# Patient Record
Sex: Female | Born: 1935 | Race: White | Hispanic: No | Smoking: Never smoker
Health system: Southern US, Community
[De-identification: ages and names within clinical notes are randomized; demographics above are authoritative.]

## PROBLEM LIST (undated history)

## (undated) DIAGNOSIS — J349 Unspecified disorder of nose and nasal sinuses: Secondary | ICD-10-CM

## (undated) DIAGNOSIS — Q613 Polycystic kidney, unspecified: Secondary | ICD-10-CM

## (undated) DIAGNOSIS — E871 Hypo-osmolality and hyponatremia: Secondary | ICD-10-CM

## (undated) DIAGNOSIS — K219 Gastro-esophageal reflux disease without esophagitis: Secondary | ICD-10-CM

## (undated) DIAGNOSIS — Z8719 Personal history of other diseases of the digestive system: Secondary | ICD-10-CM

## (undated) DIAGNOSIS — J45909 Unspecified asthma, uncomplicated: Secondary | ICD-10-CM

## (undated) DIAGNOSIS — I10 Essential (primary) hypertension: Secondary | ICD-10-CM

## (undated) DIAGNOSIS — K602 Anal fissure, unspecified: Secondary | ICD-10-CM

## (undated) DIAGNOSIS — N6019 Diffuse cystic mastopathy of unspecified breast: Secondary | ICD-10-CM

## (undated) DIAGNOSIS — M1990 Unspecified osteoarthritis, unspecified site: Secondary | ICD-10-CM

## (undated) HISTORY — DX: Unspecified osteoarthritis, unspecified site: M19.90

## (undated) HISTORY — DX: Gastro-esophageal reflux disease without esophagitis: K21.9

## (undated) HISTORY — DX: Hypo-osmolality and hyponatremia: E87.1

## (undated) HISTORY — DX: Essential (primary) hypertension: I10

## (undated) HISTORY — PX: OTHER SURGICAL HISTORY: SHX169

## (undated) HISTORY — DX: Diffuse cystic mastopathy of unspecified breast: N60.19

## (undated) HISTORY — PX: LUMBAR DISC SURGERY: SHX700

## (undated) HISTORY — DX: Anal fissure, unspecified: K60.2

## (undated) HISTORY — DX: Unspecified disorder of nose and nasal sinuses: J34.9

## (undated) HISTORY — DX: Personal history of other diseases of the digestive system: Z87.19

## (undated) HISTORY — DX: Unspecified asthma, uncomplicated: J45.909

## (undated) HISTORY — DX: Polycystic kidney, unspecified: Q61.3

---

## 1980-05-24 HISTORY — PX: MINOR HEMORRHOIDECTOMY: SHX6238

## 1980-05-24 HISTORY — PX: TOTAL ABDOMINAL HYSTERECTOMY W/ BILATERAL SALPINGOOPHORECTOMY: SHX83

## 2004-04-14 ENCOUNTER — Ambulatory Visit: Payer: Self-pay | Admitting: Nephrology

## 2004-11-02 ENCOUNTER — Emergency Department (HOSPITAL_COMMUNITY): Admission: EM | Admit: 2004-11-02 | Discharge: 2004-11-02 | Payer: Self-pay | Admitting: Emergency Medicine

## 2004-11-03 ENCOUNTER — Ambulatory Visit (HOSPITAL_COMMUNITY): Admission: RE | Admit: 2004-11-03 | Discharge: 2004-11-03 | Payer: Self-pay | Admitting: *Deleted

## 2004-12-02 ENCOUNTER — Ambulatory Visit: Payer: Self-pay | Admitting: Internal Medicine

## 2004-12-23 ENCOUNTER — Encounter: Admission: RE | Admit: 2004-12-23 | Discharge: 2004-12-23 | Payer: Self-pay | Admitting: Neurosurgery

## 2005-01-04 ENCOUNTER — Inpatient Hospital Stay (HOSPITAL_COMMUNITY): Admission: RE | Admit: 2005-01-04 | Discharge: 2005-01-05 | Payer: Self-pay | Admitting: Neurosurgery

## 2005-01-10 ENCOUNTER — Emergency Department: Payer: Self-pay | Admitting: Unknown Physician Specialty

## 2005-02-15 ENCOUNTER — Ambulatory Visit: Payer: Self-pay | Admitting: Internal Medicine

## 2005-06-07 ENCOUNTER — Ambulatory Visit: Payer: Self-pay | Admitting: Nephrology

## 2005-12-22 ENCOUNTER — Ambulatory Visit: Payer: Self-pay | Admitting: Internal Medicine

## 2006-06-28 ENCOUNTER — Ambulatory Visit: Payer: Self-pay | Admitting: Nephrology

## 2006-12-27 ENCOUNTER — Ambulatory Visit: Payer: Self-pay | Admitting: Internal Medicine

## 2007-12-29 ENCOUNTER — Ambulatory Visit: Payer: Self-pay | Admitting: Internal Medicine

## 2008-05-07 ENCOUNTER — Ambulatory Visit: Payer: Self-pay | Admitting: Nephrology

## 2008-11-22 ENCOUNTER — Ambulatory Visit: Payer: Self-pay | Admitting: General Surgery

## 2008-12-19 ENCOUNTER — Other Ambulatory Visit: Payer: Self-pay | Admitting: Internal Medicine

## 2008-12-30 ENCOUNTER — Ambulatory Visit: Payer: Self-pay | Admitting: Internal Medicine

## 2009-01-02 ENCOUNTER — Ambulatory Visit: Payer: Self-pay | Admitting: Internal Medicine

## 2009-02-03 HISTORY — PX: BREAST BIOPSY: SHX20

## 2009-08-07 ENCOUNTER — Ambulatory Visit: Payer: Self-pay | Admitting: General Surgery

## 2009-08-07 ENCOUNTER — Ambulatory Visit: Payer: Self-pay | Admitting: Nephrology

## 2010-01-05 ENCOUNTER — Ambulatory Visit: Payer: Self-pay | Admitting: Internal Medicine

## 2010-07-18 ENCOUNTER — Inpatient Hospital Stay: Payer: Self-pay | Admitting: Internal Medicine

## 2010-09-02 ENCOUNTER — Other Ambulatory Visit: Payer: Self-pay | Admitting: Internal Medicine

## 2011-01-11 ENCOUNTER — Ambulatory Visit: Payer: Self-pay | Admitting: Internal Medicine

## 2012-01-14 ENCOUNTER — Ambulatory Visit: Payer: Self-pay | Admitting: Internal Medicine

## 2012-03-23 ENCOUNTER — Telehealth: Payer: Self-pay | Admitting: Internal Medicine

## 2012-03-23 NOTE — Telephone Encounter (Signed)
I can see her tomorrow at 10:00 if she thinks she will be okay to wait until then.  Let me know if a problem.  Thanks.

## 2012-03-23 NOTE — Telephone Encounter (Signed)
Pt called she has an appointment 11/25 but she thinks she has a UTI and would like to be worked in today if possible Please advise

## 2012-03-23 NOTE — Telephone Encounter (Signed)
Pt aware of appointment 

## 2012-03-24 ENCOUNTER — Ambulatory Visit (INDEPENDENT_AMBULATORY_CARE_PROVIDER_SITE_OTHER): Payer: Medicare Other | Admitting: Internal Medicine

## 2012-03-24 ENCOUNTER — Encounter: Payer: Self-pay | Admitting: Internal Medicine

## 2012-03-24 VITALS — BP 128/72 | HR 72 | Temp 97.8°F | Ht 64.0 in | Wt 158.0 lb

## 2012-03-24 DIAGNOSIS — R5381 Other malaise: Secondary | ICD-10-CM

## 2012-03-24 DIAGNOSIS — R5383 Other fatigue: Secondary | ICD-10-CM

## 2012-03-24 DIAGNOSIS — M25559 Pain in unspecified hip: Secondary | ICD-10-CM

## 2012-03-24 DIAGNOSIS — I10 Essential (primary) hypertension: Secondary | ICD-10-CM

## 2012-03-24 DIAGNOSIS — Q613 Polycystic kidney, unspecified: Secondary | ICD-10-CM

## 2012-03-24 DIAGNOSIS — E871 Hypo-osmolality and hyponatremia: Secondary | ICD-10-CM

## 2012-03-24 DIAGNOSIS — R300 Dysuria: Secondary | ICD-10-CM

## 2012-03-24 LAB — CBC WITH DIFFERENTIAL/PLATELET
Basophils Absolute: 0 10*3/uL (ref 0.0–0.1)
Basophils Relative: 0.4 % (ref 0.0–3.0)
HCT: 36.2 % (ref 36.0–46.0)
Hemoglobin: 12 g/dL (ref 12.0–15.0)
Lymphocytes Relative: 14.7 % (ref 12.0–46.0)
MCHC: 33.1 g/dL (ref 30.0–36.0)
Neutrophils Relative %: 71.5 % (ref 43.0–77.0)
Platelets: 225 10*3/uL (ref 150.0–400.0)
RDW: 12.5 % (ref 11.5–14.6)

## 2012-03-24 LAB — POCT URINALYSIS DIPSTICK
Bilirubin, UA: NEGATIVE
Glucose, UA: NEGATIVE
Protein, UA: NEGATIVE
Spec Grav, UA: 1.01

## 2012-03-24 LAB — BASIC METABOLIC PANEL
CO2: 22 mEq/L (ref 19–32)
Chloride: 98 mEq/L (ref 96–112)
Creatinine, Ser: 1.1 mg/dL (ref 0.4–1.2)
Glucose, Bld: 99 mg/dL (ref 70–99)

## 2012-03-24 LAB — TSH: TSH: 1.57 u[IU]/mL (ref 0.35–5.50)

## 2012-03-24 MED ORDER — NITROFURANTOIN MONOHYD MACRO 100 MG PO CAPS: 100.0000 mg | ORAL_CAPSULE | Freq: Two times a day (BID) | ORAL | Status: DC

## 2012-03-24 NOTE — Patient Instructions (Addendum)
It was good to see you today.  Once I review your urine results, we will get an antibiotic called in if needed.  I am also going to schedule you to see physical therapy for your hip and let.  We will notify you of your blood test results once they are available.

## 2012-03-25 ENCOUNTER — Encounter: Payer: Self-pay | Admitting: Internal Medicine

## 2012-03-25 DIAGNOSIS — Q613 Polycystic kidney, unspecified: Secondary | ICD-10-CM | POA: Insufficient documentation

## 2012-03-25 NOTE — Assessment & Plan Note (Signed)
Blood pressure on her checks at home under good control.  Sees nephrology.  Follow.  Check metabolic panel.

## 2012-03-25 NOTE — Assessment & Plan Note (Signed)
Check sodium today.  Followed by Nephrology.

## 2012-03-25 NOTE — Assessment & Plan Note (Signed)
Reports increased fatigue.  Will check cbc, met c and tsh.

## 2012-03-25 NOTE — Assessment & Plan Note (Signed)
Renal function has been stable.  Followed by Nephrology.

## 2012-03-25 NOTE — Progress Notes (Signed)
  Subjective:    Patient ID: Sierra Gardner, female    DOB: 10-15-1924, 78 y.o.   MRN: [id number]  HPI 78 year old female with past history of polycystic kidney disease, hypertension, hyponatremia and reoccurring urinary tract infections.  She comes in today with concerns regarding a uti.  States symptoms started two days ago.  Dysuria.  No hematuria.  No nausea or vomiting.  No fever.  Taking cranberry pills - which she feels has helped.  No vaginal symptoms.  No abdominal pain.   She also reports having pain in her left knee and leg.  Saw Marian Sorrow and was diagnosed with bursitis. Was given an injection.  Helped.  Had her face and ear turn red after the injection.  Her mouth broke out as well.  Still with some discomfort.  Unable to take antiinflammatories.    Past Medical History  Diagnosis Date  . Asthma   . Polycystic kidney disease   . Hypertension   . GERD (gastroesophageal reflux disease)   . Hyponatremia     Review of Systems Patient denies any headache, lightheadedness or dizziness.  No chest pain, tightness or palpitations.  No increased shortness of breath, cough or congestion.  No nausea or vomiting.  No abdominal pain or cramping.  No bowel change, such as diarrhea, constipation, BRBPR or melana.  Urinary symptoms as outlined.  No vaginal symptoms.  Hip/leg pain as outlined.          Objective:   Physical Exam Filed Vitals:   03/24/12 1018  BP: 128/72  Pulse: 72  Temp: 97.8 F (9.80 C)   78 year old female in no acute distress.   HEENT:  Nares - clear.  OP- without lesions or erythema.  NECK:  Supple, nontender.  No audible bruit.   HEART:  Appears to be regular. LUNGS:  Without crackles or wheezing audible.  Respirations even and unlabored.   RADIAL PULSE:  Equal bilaterally.  ABDOMEN:  Soft, nontender.  No audible abdominal bruit.   EXTREMITIES:  No increased edema to be present.  Increased pain with palpation over the left lateral hip.  No pain with straight leg  raise.                   Assessment & Plan:  UTI.  Urine dip revealed trace blood with trace leukocytes.  Sent for culture.  Will treat with Macrobid bid for five days.  Follow.   MSK.  Hip and leg pain as outlined.  Injection helped.  Refer to physical therapy for evaluation and treatment.

## 2012-03-27 ENCOUNTER — Other Ambulatory Visit (INDEPENDENT_AMBULATORY_CARE_PROVIDER_SITE_OTHER): Payer: Medicare Other

## 2012-03-27 DIAGNOSIS — E871 Hypo-osmolality and hyponatremia: Secondary | ICD-10-CM

## 2012-03-27 LAB — SODIUM: Sodium: 132 mEq/L — ABNORMAL LOW (ref 135–145)

## 2012-03-28 LAB — URINE CULTURE: Colony Count: 65000

## 2012-04-17 ENCOUNTER — Encounter: Payer: Self-pay | Admitting: Internal Medicine

## 2012-04-17 ENCOUNTER — Ambulatory Visit (INDEPENDENT_AMBULATORY_CARE_PROVIDER_SITE_OTHER): Payer: Medicare Other | Admitting: Internal Medicine

## 2012-04-17 VITALS — BP 149/76 | HR 84 | Temp 97.6°F | Ht 64.0 in | Wt 158.0 lb

## 2012-04-17 DIAGNOSIS — I351 Nonrheumatic aortic (valve) insufficiency: Secondary | ICD-10-CM

## 2012-04-17 DIAGNOSIS — I10 Essential (primary) hypertension: Secondary | ICD-10-CM

## 2012-04-17 DIAGNOSIS — Q613 Polycystic kidney, unspecified: Secondary | ICD-10-CM

## 2012-04-17 DIAGNOSIS — I359 Nonrheumatic aortic valve disorder, unspecified: Secondary | ICD-10-CM

## 2012-04-17 DIAGNOSIS — E871 Hypo-osmolality and hyponatremia: Secondary | ICD-10-CM

## 2012-04-17 DIAGNOSIS — Z139 Encounter for screening, unspecified: Secondary | ICD-10-CM

## 2012-04-17 NOTE — Assessment & Plan Note (Signed)
Sodium has been relatively stable.  Followed by nephrology.  Last check here 132 - stable.  Will follow.    

## 2012-04-17 NOTE — Assessment & Plan Note (Signed)
Followed by nephrology.  Blood pressures at home under good control.  Continue same meds.  Follow metabolic panel.   

## 2012-04-17 NOTE — Assessment & Plan Note (Signed)
ECHO 09/08/11 revealed moderate aortic insufficiency.  Will follow.  Currently asymptomatic.    

## 2012-04-17 NOTE — Progress Notes (Signed)
  Subjective:    Patient ID: Sierra Gardner, female    DOB: 07-07-1924, 78 y.o.   MRN: [id number]  HPI 78 year old female with past history of polycystic kidney disease, hypertension, GERD and degenerative joint disease who comes in today for a scheduled follow up.  Reports she is doing much better.  Urinary symptoms have resolved.  No more shaky or "trembly" feelings.  Breathing is stable.  No increased sob.  Overall she feels she is doing relatively well.     Past Medical History  Diagnosis Date  . Asthma   . Polycystic kidney disease   . Hypertension   . GERD (gastroesophageal reflux disease)   . Hyponatremia   . Fibrocystic breast disease   . Degenerative joint disease   . History of cholelithiasis   . Anal fissure     Review of Systems Patient denies any headache, lightheadedness or dizziness.  No sinus or allergy symptoms.   No chest pain, tightness or palpitations.  No increased shortness of breath, cough or congestion.  No nausea or vomiting.  No abdominal pain or cramping.  No acid reflux.  No bowel change, such as diarrhea, constipation, BRBPR or melana.  No urine change.   Went to physical therapy.  Leg is better.       Objective:   Physical Exam Filed Vitals:   04/17/12 1013  BP: 149/76  Pulse: 84  Temp: 97.6 F (36.13 C)   78 year old female in no acute distress.   HEENT:  Nares - clear.  OP- without lesions or erythema.  NECK:  Supple, nontender.  No audible bruit.   HEART:  Appears to be regular. LUNGS:  Without crackles or wheezing audible.  Respirations even and unlabored.   RADIAL PULSE:  Equal bilaterally.  ABDOMEN:  Soft, nontender.  No audible abdominal bruit.   EXTREMITIES:  No increased edema to be present.  Sable.                     Assessment & Plan:  GU.  Recently treated for a bladder infection.   Symptoms have resolved.  Follow.   GI.  Colonoscopy 11/22/08 normal.  Has a history of an anal fissure.  Currently doing well.  Hemoccult card negative  4/13.    PREVIOUS BREAST CHANGE.  Saw Dr Lemar Livings.  S/p benign biopsy.  Last mammogram 01/14/12 - BiRADS II.     CARDIOVASCULAR.  ECHO  09/08/11 revealed moderate aortic insufficiency with EF 50-55%.  Follow.  Currently asymptomatic.    HEALTH MAINTENANCE.  Last physical 08/17/11.  She is s/p hysterectomy.  Colonoscopy as outlined. Will notify me when agreeable for bone density.  Mammogram as outlined above.  Had her flu shot and had a relatively recent pneumovax.

## 2012-04-17 NOTE — Patient Instructions (Signed)
It was nice seeing you today.  I am glad you are feeling better.  Let me know if you need anything.  

## 2012-04-17 NOTE — Assessment & Plan Note (Signed)
Is followed by nephrology.   Stable.   

## 2012-04-19 ENCOUNTER — Other Ambulatory Visit: Payer: Medicare Other

## 2012-04-19 ENCOUNTER — Telehealth: Payer: Self-pay | Admitting: Internal Medicine

## 2012-04-19 ENCOUNTER — Other Ambulatory Visit: Payer: Self-pay | Admitting: Internal Medicine

## 2012-04-19 DIAGNOSIS — R300 Dysuria: Secondary | ICD-10-CM

## 2012-04-19 NOTE — Telephone Encounter (Signed)
Patient has lab appoint today

## 2012-04-19 NOTE — Telephone Encounter (Signed)
Patient Information:  Caller Name: Katja  Phone: [phone]  Patient: Sierra, Gardner  Gender: Female  DOB: 15-Jan-1925  Age: 78 Years  PCP: Dale Zumbrota   Symptoms  Reason For Call & Symptoms: urgency of urine  Reviewed Health History In EMR: Yes  Reviewed Medications In EMR: Yes  Reviewed Allergies In EMR: Yes  Date of Onset of Symptoms: 04/18/2012  Guideline(s) Used:  Urination Pain - Female  Disposition Per Guideline:   See Today in Office  Reason For Disposition Reached:   Age > 50 years  Advice Given:  N/A  Office Follow Up:  Does the office need to follow up with this patient?: Yes  Instructions For The Office: review for medication needs  RN Overrode Recommendation:  Follow Up With Office Later  asking for medication to have on hand, denies current sx  RN Note:  Has to restrict her fluid intake due to hyponatremia.  Denies any sx today.  Had a sharp pain yesterday but that has diminished.  She states that due to her polycystic kidneys she would like to have an antibiotic available in case the sx return.  Uses [facility] [address] at [phone].

## 2012-04-19 NOTE — Telephone Encounter (Signed)
Confirm with pt - no other symptoms.  (ie no nausea or vomiting, increased abdominal pain, etc).  If no, then have her at least come and drop off a urine so that we can see before starting an abx.  i will place order.  She will need to come in today and drop off urine.  If questions, let me know.

## 2012-04-19 NOTE — Telephone Encounter (Signed)
See note.  Need to have pt come in for urine.  I have already put in the order

## 2012-04-21 ENCOUNTER — Telehealth: Payer: Self-pay | Admitting: *Deleted

## 2012-04-21 ENCOUNTER — Other Ambulatory Visit (INDEPENDENT_AMBULATORY_CARE_PROVIDER_SITE_OTHER): Payer: Medicare Other | Admitting: *Deleted

## 2012-04-21 ENCOUNTER — Encounter: Payer: Self-pay | Admitting: *Deleted

## 2012-04-21 DIAGNOSIS — R300 Dysuria: Secondary | ICD-10-CM

## 2012-04-21 LAB — URINALYSIS
Bilirubin Urine: NEGATIVE
Ketones, ur: NEGATIVE
Leukocytes, UA: NEGATIVE
Specific Gravity, Urine: 1.01 (ref 1.000–1.030)
Total Protein, Urine: NEGATIVE
Urine Glucose: NEGATIVE
pH: 7.5 (ref 5.0–8.0)

## 2012-04-21 NOTE — Progress Notes (Signed)
Called and gave lab results to patient. Patient stated that she is feeling better with know signs of uti symptoms.

## 2012-04-21 NOTE — Telephone Encounter (Signed)
Opened by mistake.

## 2012-04-21 NOTE — Progress Notes (Signed)
Will await cx

## 2012-04-24 LAB — CULTURE, URINE COMPREHENSIVE: Colony Count: 50000

## 2012-04-25 MED ORDER — NITROFURANTOIN MONOHYD MACRO 100 MG PO CAPS: 100.0000 mg | ORAL_CAPSULE | Freq: Two times a day (BID) | ORAL | Status: DC

## 2012-04-25 NOTE — Addendum Note (Signed)
Addended by: Algis Downs on: 04/25/2012 05:17 PM   Modules accepted: Orders

## 2012-08-08 ENCOUNTER — Other Ambulatory Visit: Payer: Medicare Other

## 2012-08-15 ENCOUNTER — Encounter: Payer: Medicare Other | Admitting: Internal Medicine

## 2012-08-15 ENCOUNTER — Other Ambulatory Visit: Payer: Medicare Other

## 2012-08-29 ENCOUNTER — Other Ambulatory Visit (INDEPENDENT_AMBULATORY_CARE_PROVIDER_SITE_OTHER): Payer: Medicare Other

## 2012-08-29 ENCOUNTER — Encounter: Payer: Self-pay | Admitting: Internal Medicine

## 2012-08-29 ENCOUNTER — Ambulatory Visit (INDEPENDENT_AMBULATORY_CARE_PROVIDER_SITE_OTHER): Payer: Medicare Other | Admitting: Internal Medicine

## 2012-08-29 VITALS — BP 140/68 | HR 80 | Temp 97.6°F | Resp 18 | Ht 62.0 in | Wt 156.8 lb

## 2012-08-29 DIAGNOSIS — I10 Essential (primary) hypertension: Secondary | ICD-10-CM

## 2012-08-29 DIAGNOSIS — I359 Nonrheumatic aortic valve disorder, unspecified: Secondary | ICD-10-CM

## 2012-08-29 DIAGNOSIS — Z139 Encounter for screening, unspecified: Secondary | ICD-10-CM

## 2012-08-29 DIAGNOSIS — Z1239 Encounter for other screening for malignant neoplasm of breast: Secondary | ICD-10-CM

## 2012-08-29 DIAGNOSIS — Q613 Polycystic kidney, unspecified: Secondary | ICD-10-CM

## 2012-08-29 DIAGNOSIS — E871 Hypo-osmolality and hyponatremia: Secondary | ICD-10-CM

## 2012-08-29 DIAGNOSIS — I351 Nonrheumatic aortic (valve) insufficiency: Secondary | ICD-10-CM

## 2012-08-29 LAB — COMPREHENSIVE METABOLIC PANEL
ALT: 13 U/L (ref 0–35)
AST: 19 U/L (ref 0–37)
Alkaline Phosphatase: 52 U/L (ref 39–117)
Calcium: 9.4 mg/dL (ref 8.4–10.5)
Chloride: 100 mEq/L (ref 96–112)
Creatinine, Ser: 1 mg/dL (ref 0.4–1.2)

## 2012-08-29 LAB — LIPID PANEL
HDL: 49.7 mg/dL (ref >39.00)
LDL Cholesterol: 99 mg/dL (ref 0–99)
Total CHOL/HDL Ratio: 3
VLDL: 16.2 mg/dL (ref 0.0–40.0)

## 2012-08-29 NOTE — Progress Notes (Signed)
Subjective:    Patient ID: Sierra Gardner, female    DOB: [birth year], 79 y.o.   MRN: [id number]  HPI 79 year old female with past history of polycystic kidney disease, hypertension, GERD and degenerative joint disease who comes in today to follow up on these issues as well as for a complete physical exam.   Reports she is doing much better.  Urinary symptoms have resolved.  She is taking a cranberry tablet daily and feels this helps.  Breathing is stable.  No increased sob.  Overall she feels she is doing relatively well.  Had follow up with nephrology two weeks ago.  Felt things were stable.     Past Medical History  Diagnosis Date  . Asthma   . Polycystic kidney disease   . Hypertension   . GERD (gastroesophageal reflux disease)   . Hyponatremia   . Fibrocystic breast disease   . Degenerative joint disease   . History of cholelithiasis   . Anal fissure     Current Outpatient Prescriptions on File Prior to Visit  Medication Sig Dispense Refill  . Calcium Carbonate-Vitamin D (CALCIUM 600 + D PO) Take 600 mg by mouth 2 (two) times daily.       . cholecalciferol (VITAMIN D) 1000 UNITS tablet Take 2,000 Units by mouth daily.      . lansoprazole (PREVACID) 30 MG capsule Take 30 mg by mouth daily.      Marland Kitchen lisinopril (PRINIVIL,ZESTRIL) 40 MG tablet Take 40 mg by mouth 2 (two) times daily.       . Magnesium 250 MG TABS Take 250 mg by mouth daily.       . nitrofurantoin, macrocrystal-monohydrate, (MACROBID) 100 MG capsule Take 1 capsule (100 mg total) by mouth 2 (two) times daily.  10 capsule  0  . spironolactone (ALDACTONE) 25 MG tablet 25 mg. 1 1/2 tablet q day      . verapamil (CALAN) 120 MG tablet Take 120 mg by mouth. Takes 1/2 tablet daily      . vitamin C (ASCORBIC ACID) 500 MG tablet Take 500 mg by mouth daily.       No current facility-administered medications on file prior to visit.    Review of Systems Patient denies any headache, lightheadedness or dizziness.  No sinus or  allergy symptoms.   No chest pain, tightness or palpitations.  No increased shortness of breath, cough or congestion.  No nausea or vomiting.  No abdominal pain or cramping.  No acid reflux.  No bowel change, such as diarrhea, constipation, BRBPR or melana.  No urine change.  Cranberry tablet helping.       Objective:   Physical Exam  Filed Vitals:   08/29/12 1025  BP: 140/68  Pulse: 80  Temp: 97.6 F (36.4 C)  Resp: 8   79 year old female in no acute distress.   HEENT:  Nares- clear.  Oropharynx - without lesions. NECK:  Supple.  Nontender.  No audible bruit.  HEART:  Appears to be regular. LUNGS:  No crackles or wheezing audible.  Respirations even and unlabored.  RADIAL PULSE:  Equal bilaterally.    BREASTS:  No nipple discharge or nipple retraction present.  Could not appreciate any distinct nodules or axillary adenopathy.  ABDOMEN:  Soft, nontender.  Bowel sounds present and normal.  No audible abdominal bruit.  GU:  She declined.    RECTAL:  She declined.    EXTREMITIES:  No increased edema present.  DP pulses palpable  and equal bilaterally.             Assessment & Plan:  GU.  Recently treated for a bladder infection.   Symptoms have resolved.  Follow.  Taking a cranberry tablet daily now and she feels this is helping.  Follow.   GI.  Colonoscopy 11/22/08 normal.  Has a history of an anal fissure.  Currently doing well.    PREVIOUS BREAST CHANGE.  Saw Dr Lemar Livings.  S/p benign biopsy.  Last mammogram 01/14/12 - BiRADS II.  Schedule a follow up mammogram when due.     CARDIOVASCULAR.  ECHO  09/08/11 revealed moderate aortic insufficiency with EF 50-55%.  Follow.  Currently asymptomatic.    HEALTH MAINTENANCE.  Physical today.  She is s/p hysterectomy.  Colonoscopy as outlined. Will notify me when agreeable for bone density.  Mammogram as outlined above.  Schedule a follow up mammogram when due.  Had her flu shot and had a relatively recent pneumovax.

## 2012-09-01 ENCOUNTER — Encounter: Payer: Self-pay | Admitting: Internal Medicine

## 2012-09-03 ENCOUNTER — Encounter: Payer: Self-pay | Admitting: Internal Medicine

## 2012-09-03 NOTE — Assessment & Plan Note (Signed)
Is followed by nephrology.   Stable.   

## 2012-09-03 NOTE — Assessment & Plan Note (Signed)
Sodium has been relatively stable.  Followed by nephrology.  Last check here 132 - stable.  Will follow.

## 2012-09-03 NOTE — Assessment & Plan Note (Signed)
Followed by nephrology.  Blood pressures at home under good control.  Continue same meds.  Follow metabolic panel.   

## 2012-09-03 NOTE — Assessment & Plan Note (Signed)
ECHO 09/08/11 revealed moderate aortic insufficiency.  Will follow.  Currently asymptomatic.

## 2012-11-10 ENCOUNTER — Encounter: Payer: Self-pay | Admitting: Adult Health

## 2012-11-10 ENCOUNTER — Telehealth: Payer: Self-pay | Admitting: Internal Medicine

## 2012-11-10 ENCOUNTER — Ambulatory Visit (INDEPENDENT_AMBULATORY_CARE_PROVIDER_SITE_OTHER): Payer: Medicare Other | Admitting: Adult Health

## 2012-11-10 VITALS — BP 142/60 | HR 70 | Temp 98.2°F | Resp 12 | Wt 155.5 lb

## 2012-11-10 DIAGNOSIS — R300 Dysuria: Secondary | ICD-10-CM | POA: Insufficient documentation

## 2012-11-10 LAB — POCT URINALYSIS DIPSTICK
Bilirubin, UA: NEGATIVE
Leukocytes, UA: NEGATIVE
Nitrite, UA: NEGATIVE
Protein, UA: 30
Urobilinogen, UA: 0.2
pH, UA: 7

## 2012-11-10 MED ORDER — NITROFURANTOIN MONOHYD MACRO 100 MG PO CAPS: 100.0000 mg | ORAL_CAPSULE | Freq: Two times a day (BID) | ORAL | Status: DC

## 2012-11-10 NOTE — Progress Notes (Signed)
  Subjective:    Patient ID: Sierra Gardner, female    DOB: 06-13-1924, 79 y.o.   MRN: [id number]  HPI  Patient presents to clinic with complaints of one day history of dysuria, urgency and frequency. She reports history of UTIs treated with Macrodantin. She denies fever, chills or hematuria.  Current Outpatient Prescriptions on File Prior to Visit  Medication Sig Dispense Refill  . Calcium Carbonate-Vitamin D (CALCIUM 600 + D PO) Take 600 mg by mouth 2 (two) times daily.       . cholecalciferol (VITAMIN D) 1000 UNITS tablet Take 2,000 Units by mouth daily.      . lansoprazole (PREVACID) 30 MG capsule Take 30 mg by mouth daily.      Marland Kitchen lisinopril (PRINIVIL,ZESTRIL) 40 MG tablet Take 40 mg by mouth 2 (two) times daily.       . Magnesium 250 MG TABS Take 250 mg by mouth daily.       Marland Kitchen spironolactone (ALDACTONE) 25 MG tablet 25 mg. 1 1/2 tablet q day      . verapamil (CALAN) 120 MG tablet Take 120 mg by mouth. Takes 1/2 tablet daily      . vitamin C (ASCORBIC ACID) 500 MG tablet Take 500 mg by mouth daily.       No current facility-administered medications on file prior to visit.     Review of Systems  Genitourinary: Positive for dysuria, frequency and hematuria.   BP 142/60  Pulse 70  Temp(Src) 98.2 F (36.8 C) (Oral)  Resp 12  Wt 155 lb 8 oz (70.534 kg)  BMI 28.43 kg/m2  SpO2 95%    Objective:   Physical Exam  Constitutional: She is oriented to person, place, and time.  Genitourinary:  Suprapubic tenderness  Neurological: She is alert and oriented to person, place, and time. She has normal reflexes.  Skin: Skin is warm and dry.  Psychiatric: She has a normal mood and affect. Her behavior is normal. Judgment and thought content normal.          Assessment & Plan:

## 2012-11-10 NOTE — Assessment & Plan Note (Signed)
Start Macrobid 100 mg twice a day x5 days. RTC if symptoms are not improved within completion of treatment or sooner if symptoms worsen.

## 2012-11-10 NOTE — Telephone Encounter (Signed)
Patient Information:  Caller Name: Ahmaya  Phone: [phone]  Patient: Sierra Gardner, Sierra Gardner  Gender: Female  DOB: Dec 26, 1924  Age: 79 Years  PCP: Dale Gil[place]  Office Follow Up:  Does the office need to follow up with this patient?: No  Instructions For The Office: N/A  RN Note:  pt wanted to know if she needed to bring in specimen or have meds called in for her.  RN advised for pt to be seen  Symptoms  Reason For Call & Symptoms: UTI symptoms:  pain with urination.  No back pain  Reviewed Health History In EMR: Yes  Reviewed Medications In EMR: Yes  Reviewed Allergies In EMR: Yes  Reviewed Surgeries / Procedures: Yes  Date of Onset of Symptoms: 11/09/2012  Guideline(s) Used:  Urination Pain - Female  Disposition Per Guideline:   See Today in Office  Reason For Disposition Reached:   All other females with painful urination, or patient wants to be seen  Advice Given:  N/A  Patient Will Follow Care Advice:  YES  Appointment Scheduled:  11/10/2012 11:15:00 Appointment Scheduled Provider:  Orville Govern

## 2012-11-22 ENCOUNTER — Telehealth: Payer: Self-pay | Admitting: Internal Medicine

## 2012-11-22 NOTE — Telephone Encounter (Signed)
Patient Information:  Caller Name: Lux  Phone: [phone]  Patient: Sierra Gardner, Sierra Gardner  Gender: Female  DOB: Nov 02, 1924  Age: 79 Years  PCP: Dale [place]  Office Follow Up:  Does the office need to follow up with this patient?: Yes  Instructions For The Office: Please call the pt and advise if she can "drop of a urine specimen"   Symptoms  Reason For Call & Symptoms: Pt was seen at the office 12 days ago. She was dx with a UTI and put on 5 days of Macrobid. Pt states her sx improved but she still has urgency/frequency and dysuria. Pt would like to bring a specimen over to be tested. She has a testing kit at home.  Reviewed Health History In EMR: Yes  Reviewed Medications In EMR: Yes  Reviewed Allergies In EMR: Yes  Reviewed Surgeries / Procedures: Yes  Date of Onset of Symptoms: 11/20/2012  Guideline(s) Used:  Urination Pain - Female  Disposition Per Guideline:   See Today in Office  Reason For Disposition Reached:   Taking antibiotic > 3 days for UTI and painful urination not improved  Advice Given:  N/A  Patient Refused Recommendation:  Pt would like to drop off a sample of urine to be tested.

## 2012-11-22 NOTE — Telephone Encounter (Signed)
Raquel has a 4:15 if you would like her to be seen, or can she just drop off a sample?

## 2012-11-22 NOTE — Telephone Encounter (Signed)
Unable to call in abx over the phone.  She needs to be seen.  I can see her tomorrow at 9:15.  Block spot.  Thanks.

## 2012-11-22 NOTE — Telephone Encounter (Signed)
Pt coming in tomorrow

## 2012-11-23 ENCOUNTER — Encounter: Payer: Self-pay | Admitting: Internal Medicine

## 2012-11-23 ENCOUNTER — Ambulatory Visit (INDEPENDENT_AMBULATORY_CARE_PROVIDER_SITE_OTHER): Payer: Medicare Other | Admitting: Internal Medicine

## 2012-11-23 VITALS — BP 130/70 | HR 67 | Resp 18 | Ht 62.0 in | Wt 155.2 lb

## 2012-11-23 DIAGNOSIS — I10 Essential (primary) hypertension: Secondary | ICD-10-CM

## 2012-11-23 DIAGNOSIS — R300 Dysuria: Secondary | ICD-10-CM

## 2012-11-23 DIAGNOSIS — N390 Urinary tract infection, site not specified: Secondary | ICD-10-CM

## 2012-11-23 LAB — POCT URINALYSIS DIPSTICK
Bilirubin, UA: NEGATIVE
Glucose, UA: NEGATIVE
Ketones, UA: NEGATIVE
Spec Grav, UA: 1.015

## 2012-11-23 MED ORDER — CEPHALEXIN 500 MG PO CAPS: 500.0000 mg | ORAL_CAPSULE | Freq: Three times a day (TID) | ORAL | Status: DC

## 2012-11-23 NOTE — Progress Notes (Signed)
  Subjective:    Patient ID: Sierra Gardner, female    DOB: [birth year], 79 y.o.   MRN: [id number]  Dysuria   79 year old female with past history of polycystic kidney disease, hypertension, hyponatremia and reoccurring urinary tract infections.  She comes in today with concerns regarding a uti.  States symptoms started a few days ago.  Dysuria.  No hematuria.  No nausea or vomiting.  No fever.  Taking cranberry pills - which she feels has helped.  No vaginal symptoms.  No abdominal pain.  Eating and drinking ok.  States the last time she took macrobid, felt sick on her stomach.  Has previously tolerated this medication.     Past Medical History  Diagnosis Date  . Asthma   . Polycystic kidney disease   . Hypertension   . GERD (gastroesophageal reflux disease)   . Hyponatremia   . Fibrocystic breast disease   . Degenerative joint disease   . History of cholelithiasis   . Anal fissure     Current Outpatient Prescriptions on File Prior to Visit  Medication Sig Dispense Refill  . Calcium Carbonate-Vitamin D (CALCIUM 600 + D PO) Take 600 mg by mouth 2 (two) times daily.       . cholecalciferol (VITAMIN D) 1000 UNITS tablet Take 2,000 Units by mouth daily.      . lansoprazole (PREVACID) 30 MG capsule Take 30 mg by mouth daily.      Marland Kitchen lisinopril (PRINIVIL,ZESTRIL) 40 MG tablet Take 40 mg by mouth 2 (two) times daily.       . Magnesium 250 MG TABS Take 250 mg by mouth daily.       Marland Kitchen spironolactone (ALDACTONE) 25 MG tablet 25 mg. 1 1/2 tablet q day      . verapamil (CALAN) 120 MG tablet Take 120 mg by mouth. Takes 1/2 tablet daily      . vitamin C (ASCORBIC ACID) 500 MG tablet Take 500 mg by mouth daily.       No current facility-administered medications on file prior to visit.    Review of Systems  Genitourinary: Positive for dysuria.  Patient denies any headache, lightheadedness or dizziness.  No chest pain, tightness or palpitations.  Breathing is stable.  No nausea or vomiting.  No  abdominal pain or cramping.  No bowel change, such as diarrhea.  Bowels stable. Urinary symptoms as outlined.  No vaginal symptoms.           Objective:   Physical Exam  Filed Vitals:   11/23/12 0912  BP: 130/70  Pulse: 67  Resp: 63   79 year old female in no acute distress. HEART:  Appears to be regular. LUNGS:  Without crackles or wheezing audible.  Respirations even and unlabored. ABDOMEN:  Soft, nontender.  No audible abdominal bruit.                  Assessment & Plan:  UTI.  Urine dip revealed small amount of blood with moderate leukocytes.  Sent for culture.  Will treat with keflex  bid for seven days.  Follow.  She has multiple drug allergies.  Had problems with macrobid - last infection (GI intolerance?)

## 2012-11-27 ENCOUNTER — Encounter: Payer: Self-pay | Admitting: Internal Medicine

## 2012-11-27 NOTE — Assessment & Plan Note (Signed)
Symptoms and exam as outlined.  Urine dip as outlined.  Treat wit Keflex.  Culture sent.

## 2012-11-27 NOTE — Assessment & Plan Note (Signed)
Blood pressure under control.  Follow.

## 2012-12-29 ENCOUNTER — Ambulatory Visit (INDEPENDENT_AMBULATORY_CARE_PROVIDER_SITE_OTHER): Payer: Medicare Other | Admitting: Internal Medicine

## 2012-12-29 ENCOUNTER — Encounter: Payer: Self-pay | Admitting: Internal Medicine

## 2012-12-29 VITALS — BP 130/64 | HR 68 | Temp 98.2°F | Ht 62.0 in | Wt 156.5 lb

## 2012-12-29 DIAGNOSIS — I10 Essential (primary) hypertension: Secondary | ICD-10-CM

## 2012-12-29 DIAGNOSIS — Q613 Polycystic kidney, unspecified: Secondary | ICD-10-CM

## 2012-12-29 DIAGNOSIS — I359 Nonrheumatic aortic valve disorder, unspecified: Secondary | ICD-10-CM

## 2012-12-29 DIAGNOSIS — M4800 Spinal stenosis, site unspecified: Secondary | ICD-10-CM

## 2012-12-29 DIAGNOSIS — E871 Hypo-osmolality and hyponatremia: Secondary | ICD-10-CM

## 2012-12-29 DIAGNOSIS — I351 Nonrheumatic aortic (valve) insufficiency: Secondary | ICD-10-CM

## 2012-12-29 LAB — BASIC METABOLIC PANEL
BUN: 21 mg/dL (ref 6–23)
CO2: 24 mEq/L (ref 19–32)
Chloride: 95 mEq/L — ABNORMAL LOW (ref 96–112)
Glucose, Bld: 95 mg/dL (ref 70–99)
Potassium: 4.6 mEq/L (ref 3.5–5.1)

## 2012-12-29 NOTE — Progress Notes (Signed)
Subjective:    Patient ID: Sierra Gardner, female    DOB: 06-21-1924, 79 y.o.   MRN: [id number]  HPI 79 year old female with past history of polycystic kidney disease, hypertension, GERD and degenerative joint disease who comes in today for a scheduled follow up.  Reports she is doing much better.  Urinary symptoms have resolved.  She is taking a cranberry tablet daily and feels this helps.  Breathing is stable.  No increased sob.  Overall she feels she is doing relatively well.  Still seeing nephrology.  Due follow up in the fall 2014.  Has follow up with Dr Darrold Junker next month.  Blood pressure has been doing well on outside checks averaging 110-120s/50-60s.  She fell approximately one week ago - in her driveway.  Had some soreness in her neck.  Better now.  Some minimal bruises.  No headache or dizziness after the fall.  Feels better.  She has seen Dr Rosita Kea.  Had xray reviewed.  Has nerve damage.  Has stenosis.  Right leg shorter.  She has started putting something in her shoe and this has helped.  Overall she feels things are stable.      Past Medical History  Diagnosis Date  . Asthma   . Polycystic kidney disease   . Hypertension   . GERD (gastroesophageal reflux disease)   . Hyponatremia   . Fibrocystic breast disease   . Degenerative joint disease   . History of cholelithiasis   . Anal fissure     Current Outpatient Prescriptions on File Prior to Visit  Medication Sig Dispense Refill  . Calcium Carbonate-Vitamin D (CALCIUM 600 + D PO) Take 600 mg by mouth 2 (two) times daily.       . cholecalciferol (VITAMIN D) 1000 UNITS tablet Take 2,000 Units by mouth daily.      . lansoprazole (PREVACID) 30 MG capsule Take 30 mg by mouth daily.      Marland Kitchen lisinopril (PRINIVIL,ZESTRIL) 40 MG tablet Take 40 mg by mouth 2 (two) times daily.       . Magnesium 250 MG TABS Take 250 mg by mouth daily.       Marland Kitchen spironolactone (ALDACTONE) 25 MG tablet 25 mg. 1 1/2 tablet q day      . verapamil (CALAN) 120 MG  tablet Take 120 mg by mouth. Takes 1/2 tablet daily      . vitamin C (ASCORBIC ACID) 500 MG tablet Take 500 mg by mouth daily.       No current facility-administered medications on file prior to visit.    Review of Systems Patient denies any headache, lightheadedness or dizziness.  No sinus or allergy symptoms.   No chest pain, tightness or palpitations.  No increased shortness of breath, cough or congestion.  Breathing stable.  No nausea or vomiting.  No abdominal pain or cramping.  No acid reflux.  No bowel change, such as diarrhea, constipation, BRBPR or melana.  No urine change.  No significant residual pain from the fall.       Objective:   Physical Exam  Filed Vitals:   12/29/12 0912  BP: 130/64  Pulse: 68  Temp: 98.2 F (33.42 C)   79 year old female in no acute distress.   HEENT:  Nares- clear.  Oropharynx - without lesions. NECK:  Supple.  Nontender.  No audible bruit.  HEART:  Appears to be regular. LUNGS:  No crackles or wheezing audible.  Respirations even and unlabored.  RADIAL PULSE:  Equal bilaterally.   ABDOMEN:  Soft, nontender.  Bowel sounds present and normal.  No audible abdominal bruit.     EXTREMITIES:  No increased edema present.  DP pulses palpable and equal bilaterally.             Assessment & Plan:  GU.  Recently treated for a bladder infection.   Symptoms have resolved.  Follow.  Taking a cranberry tablet daily now and she feels this is helping.  Follow.   S/P FALL.  Doing well.  No residual pain or problems.    GI.  Colonoscopy 11/22/08 normal.  Has a history of an anal fissure.  Currently doing well.    PREVIOUS BREAST CHANGE.  Saw Dr Lemar Livings.  S/p benign biopsy.  Last mammogram 01/14/12 - BiRADS II.  Schedule a follow up mammogram when due.     CARDIOVASCULAR.  ECHO  09/08/11 revealed moderate aortic insufficiency with EF 50-55%.  Follow.  Currently asymptomatic.    HEALTH MAINTENANCE.  Physical 08/29/12.  She is s/p hysterectomy.  Colonoscopy as  outlined. Will notify me when agreeable for bone density.  Mammogram as outlined above.

## 2012-12-31 ENCOUNTER — Encounter: Payer: Self-pay | Admitting: Internal Medicine

## 2012-12-31 DIAGNOSIS — M4800 Spinal stenosis, site unspecified: Secondary | ICD-10-CM | POA: Insufficient documentation

## 2012-12-31 NOTE — Assessment & Plan Note (Addendum)
ECHO 09/08/11 revealed moderate aortic insufficiency.  Will follow.  Currently asymptomatic.  Has follow up with Dr Darrold Junker next month.

## 2012-12-31 NOTE — Assessment & Plan Note (Signed)
Is followed by nephrology.   Stable.  Due follow up in the fall 2014.  Planning for follow up scanning at that time.

## 2012-12-31 NOTE — Assessment & Plan Note (Signed)
Sodium has been relatively stable.  Followed by nephrology.  Will follow.   

## 2012-12-31 NOTE — Assessment & Plan Note (Signed)
Saw Dr Rosita Kea.  Support in her shoe has helped.   Follow.

## 2012-12-31 NOTE — Assessment & Plan Note (Signed)
Followed by nephrology.  Blood pressures at home under good control.  Continue same meds.  Follow metabolic panel.   

## 2013-01-01 ENCOUNTER — Other Ambulatory Visit: Payer: Self-pay | Admitting: Internal Medicine

## 2013-01-01 DIAGNOSIS — E871 Hypo-osmolality and hyponatremia: Secondary | ICD-10-CM

## 2013-01-01 NOTE — Progress Notes (Signed)
Order placed for f/u sodium.  ?

## 2013-01-04 ENCOUNTER — Other Ambulatory Visit (INDEPENDENT_AMBULATORY_CARE_PROVIDER_SITE_OTHER): Payer: Medicare Other

## 2013-01-04 DIAGNOSIS — E871 Hypo-osmolality and hyponatremia: Secondary | ICD-10-CM

## 2013-01-04 LAB — SODIUM: Sodium: 129 mEq/L — ABNORMAL LOW (ref 135–145)

## 2013-01-05 ENCOUNTER — Other Ambulatory Visit: Payer: Self-pay | Admitting: Internal Medicine

## 2013-01-05 DIAGNOSIS — E871 Hypo-osmolality and hyponatremia: Secondary | ICD-10-CM

## 2013-01-05 NOTE — Progress Notes (Signed)
Order placed for f/u sodium.  ?

## 2013-01-09 ENCOUNTER — Other Ambulatory Visit (INDEPENDENT_AMBULATORY_CARE_PROVIDER_SITE_OTHER): Payer: Medicare Other

## 2013-01-09 DIAGNOSIS — E871 Hypo-osmolality and hyponatremia: Secondary | ICD-10-CM

## 2013-01-10 ENCOUNTER — Encounter: Payer: Self-pay | Admitting: *Deleted

## 2013-01-10 ENCOUNTER — Other Ambulatory Visit: Payer: Self-pay | Admitting: Internal Medicine

## 2013-01-10 DIAGNOSIS — E871 Hypo-osmolality and hyponatremia: Secondary | ICD-10-CM

## 2013-01-10 NOTE — Progress Notes (Signed)
Follow up sodium lab ordered.

## 2013-01-15 ENCOUNTER — Ambulatory Visit: Payer: Self-pay | Admitting: Internal Medicine

## 2013-01-16 ENCOUNTER — Ambulatory Visit (INDEPENDENT_AMBULATORY_CARE_PROVIDER_SITE_OTHER): Payer: Medicare Other | Admitting: Adult Health

## 2013-01-16 ENCOUNTER — Encounter: Payer: Self-pay | Admitting: Adult Health

## 2013-01-16 VITALS — BP 140/60 | HR 69 | Temp 97.9°F | Resp 12 | Wt 155.0 lb

## 2013-01-16 DIAGNOSIS — N390 Urinary tract infection, site not specified: Secondary | ICD-10-CM

## 2013-01-16 DIAGNOSIS — R300 Dysuria: Secondary | ICD-10-CM

## 2013-01-16 LAB — POCT URINALYSIS DIPSTICK
Glucose, UA: NEGATIVE
Nitrite, UA: NEGATIVE
Spec Grav, UA: 1.015
Urobilinogen, UA: 0.2

## 2013-01-16 NOTE — Progress Notes (Signed)
  Subjective:    Patient ID: Sierra Gardner, female    DOB: 07/19/1924, 79 y.o.   MRN: [id number]  HPI  Patient is a pleasant 79 year old female who presents to clinic status post visit to urgent care on 01/06/2013 for UTI. She was started on Macrobid and took this medication for 7 days. She has completed her course. She is here for recheck of urinalysis. She is feeling much improved. Denies dysuria, suprapubic tenderness, fever or chills.  Current Outpatient Prescriptions on File Prior to Visit  Medication Sig Dispense Refill  . Biotin 1000 MCG tablet Take 1,000 mcg by mouth daily.      . Calcium Carbonate-Vitamin D (CALCIUM 600 + D PO) Take 600 mg by mouth 2 (two) times daily.       . cholecalciferol (VITAMIN D) 1000 UNITS tablet Take 2,000 Units by mouth daily.      Marland Kitchen CRANBERRY PO Take by mouth.      . lansoprazole (PREVACID) 30 MG capsule Take 30 mg by mouth daily.      Marland Kitchen lisinopril (PRINIVIL,ZESTRIL) 40 MG tablet Take 40 mg by mouth 2 (two) times daily.       . Magnesium 250 MG TABS Take 250 mg by mouth daily.       Marland Kitchen spironolactone (ALDACTONE) 25 MG tablet 25 mg. 1 1/2 tablet q day      . verapamil (CALAN) 120 MG tablet Take 120 mg by mouth. Takes 1/2 tablet daily      . vitamin C (ASCORBIC ACID) 500 MG tablet Take 500 mg by mouth daily.       No current facility-administered medications on file prior to visit.    Review of Systems  Constitutional: Negative for fever and chills.  Respiratory: Negative.   Cardiovascular: Negative.   Gastrointestinal: Negative.   Genitourinary: Negative.     BP 140/60  Pulse 69  Temp(Src) 97.9 F (36.6 C) (Oral)  Resp 12  Wt 155 lb (70.308 kg)  BMI 28.34 kg/m2  SpO2 99%     Objective:   Physical Exam  Constitutional: She is oriented to person, place, and time.  79 year old female in no apparent distress  Cardiovascular: Normal rate, regular rhythm and normal heart sounds.   Pulmonary/Chest: Effort normal and breath sounds normal. No  respiratory distress. She has no wheezes. She has no rales.  Genitourinary:  No suprapubic tenderness  Neurological: She is alert and oriented to person, place, and time.  Psychiatric: She has a normal mood and affect. Her behavior is normal. Judgment and thought content normal.        Assessment & Plan:

## 2013-01-16 NOTE — Assessment & Plan Note (Signed)
Followup of dysuria and recent UTI treated with Macrobid x7. Symptoms are resolved. UA dipstick shows no evidence of infection. No blood, no leukocytes, or nitrites.

## 2013-01-16 NOTE — Patient Instructions (Addendum)
  Your urinalysis was normal this morning.  Below is additional information regarding urinary tract infections that you may find helpful:   The urinary tract is the group of organs in the body that handle urine. The urinary tract includes the:   Kidneys, two bean-shaped organs that filter the blood to make urine  Bladder, a balloon-shaped organ that stores urine  Ureters, two tubes that carry urine from the kidneys to the bladder  Urethra, the tube that carries urine from the bladder to the outside of the body   What are urinary tract infections? - Urinary tract infections, also called "UTIs," are infections that affect either the bladder or the kidneys. Bladder infections are more common than kidney infections. Bladder infections happen when bacteria get into the urethra and travel up into the bladder. Kidney infections happen when the bacteria travel even higher, up into the kidneys. Both bladder and kidney infections are more common in women than men.   What are the symptoms of a bladder infection? - The symptoms include:   Pain or a burning feeling when you urinate  The need to urinate often  The need to urinate suddenly or in a hurry  Blood in the urine   What are the symptoms of a kidney infection? - The symptoms of a kidney infection can include the symptoms of a bladder infection, but kidney infections can also cause:   Fever  Back pain  Nausea or vomiting   How are urinary tract infections treated? - Most urinary tract infections are treated with antibiotic pills. These pills work by killing the germs that cause the infection.  If you have a bladder infection, you will probably need to take antibiotics for 3 to 7 days. If you have a kidney infection, you will probably need to take antibiotics for longer-maybe for up to 2 weeks. If you have a kidney infection, it's also possible you will need to be treated in the hospital.   Your symptoms should begin to improve within a day  of starting antibiotics. But you should finish all the antibiotic pills you get. Otherwise your infection might come back.  If needed, you can also take a medicine to numb your bladder such as AZO or Urostat which are sold over the counter.   Can cranberry juice or other cranberry products prevent bladder infections? - The studies suggesting that cranberry products prevent bladder infections are not very good. But if you want to try cranberry products for this purpose, there is probably not much harm in doing so.

## 2013-01-17 ENCOUNTER — Ambulatory Visit: Payer: Self-pay | Admitting: Internal Medicine

## 2013-01-23 ENCOUNTER — Other Ambulatory Visit (INDEPENDENT_AMBULATORY_CARE_PROVIDER_SITE_OTHER): Payer: Medicare Other

## 2013-01-23 DIAGNOSIS — E871 Hypo-osmolality and hyponatremia: Secondary | ICD-10-CM

## 2013-01-23 LAB — SODIUM: Sodium: 129 mEq/L — ABNORMAL LOW (ref 135–145)

## 2013-01-30 ENCOUNTER — Encounter: Payer: Self-pay | Admitting: *Deleted

## 2013-01-31 ENCOUNTER — Telehealth: Payer: Self-pay | Admitting: Internal Medicine

## 2013-01-31 NOTE — Telephone Encounter (Signed)
Calling to inform Dr. Lorin Picket that she will be having MRI on 9/17 and that doctor will send the report to Dr. Lorin Picket.

## 2013-01-31 NOTE — Telephone Encounter (Signed)
noted 

## 2013-01-31 NOTE — Telephone Encounter (Signed)
FYI

## 2013-02-22 ENCOUNTER — Other Ambulatory Visit: Payer: Self-pay

## 2013-02-22 ENCOUNTER — Ambulatory Visit (INDEPENDENT_AMBULATORY_CARE_PROVIDER_SITE_OTHER): Payer: Medicare Other | Admitting: General Surgery

## 2013-02-22 ENCOUNTER — Encounter: Payer: Self-pay | Admitting: General Surgery

## 2013-02-22 VITALS — BP 134/60 | HR 80 | Resp 16 | Ht 62.0 in | Wt 155.0 lb

## 2013-02-22 DIAGNOSIS — R920 Mammographic microcalcification found on diagnostic imaging of breast: Secondary | ICD-10-CM | POA: Insufficient documentation

## 2013-02-22 NOTE — Progress Notes (Signed)
Patient has been scheduled for a left breast stereotactic biopsy at [facility] for 03/05/13 at 3:00 pm. She will check-in at the [facility] at 2:30 pm. This patient is aware of date, time, and instructions. Patient verbalizes understanding.

## 2013-02-22 NOTE — Patient Instructions (Signed)
Breast Biopsy, Stereotactic A stereotactic breast biopsy takes a tissue sample from the breast with a special instrument. This is done when:  The problem (lump, abnormality, mass) can be seen on X-ray, but not felt on physical exam.  Suspicious, small calcium deposits (calcifications) are seen in the breast.  There is a change in shape or appearance of the breast, thickening, or asymmetry on mammogram (breast X-ray).  You have nipple changes (unusual or bloody discharge, crusting, retraction, dimpling).  Your caregiver is making a surgical diagnosis. The biopsy may be done on a special table, with your face down and your breasts placed through openings in the table. Computerized imaging (special form of X-rays) is used. The images are not obtained using regular X-ray film. So, exposure to radiation is reduced. Images are seen through several different angles. The surgeon removes small pieces of the suspicious tissue through a hollow needle. The tissue will be sent to the lab for analysis. The surgeon can look at the pictures right away, rather than wait for an X-ray to be developed. Your caregiver can mark the lesions (abnormal tissue formations) electronically. Then the computer can tell exactly where the problem is, or if it has moved. BENEFITS OF THE PROCEDURE  This is a good way to see if tiny lumps, abnormal looking tissue, or calcium deposits that you cannot feel are cancerous or require further treatment or follow-up.  Needle biopsy is a simple procedure. It may be performed in an outpatient imaging center. This means you have the procedure and go home the same day, without checking into a hospital.  It is less painful than open surgery. The results are as accurate as when a tissue sample is removed surgically.  The procedure is faster, less expensive, less invasive, does not distort the breast, and leaves little or no scar.  Breast defects, which can make future mammograms hard to read  and interpret, do not remain.  Recovery time is brief. Patients can soon resume their normal activities.  Using VAD (vacuum assisted device) may make it possible to remove entire lesions.  A breast biopsy can indicate if you need surgery, other treatment, or combined treatment. LET YOUR CAREGIVER KNOW ABOUT:  Allergies.  Medications taken, including herbs, eye drops, over-the-counter medications, and creams.  Use of steroids (by mouth or creams).  Previous problems with anesthetics or numbing medication.  If you are taking blood thinner medications or aspirin.  Possibility of pregnancy, if this applies.  History of blood clots (thrombophlebitis).  History of bleeding or blood problems.  Previous surgery.  Other health problems. RISKS AND COMPLICATIONS  Infection (germ growing in the wound). This can often be treated with antibiotics.  Bleeding, following surgery. Your surgeon takes every precaution to keep this from happening.  There is some concern that if a cancerous mass is present, cancer cells might be spread by the needle. Whether this actually happens is not known. It does not appear to be a significant risk.  X-ray guided breast biopsy is not infallible (not always correct). The problem may be missed or the extent of the problem may be underestimated. This would mean the biopsy did not manage to remove a piece of the diseased tissue or enough of the diseased tissue.  Lesions present, with calcium deposits scattered throughout the breast, are difficult to target by stereotactic method. Those lesions near the chest wall also are hard to learn about by this method. If the mammogram shows only a vague change in tissue density,  but no definite mass or nodule, the X-ray guided method may not be successful. Occasionally, even after a successful biopsy, the tissue diagnosis remains uncertain. A surgical biopsy will be needed, if abnormal or precancerous cells are found on core  biopsy.  Altering or deforming of the breast.  Unable to find, or missing the lesion.  Rarely, the needle may go through the chest wall into the lung area. TWO BIOPSY INSTRUMENTS MAY BE USED IN THE PROCEDURE The conventional biopsy device (core needle biopsy device) consists of an inner needle with a trough extending from it at one end, and an overlying sheath. It is attached to a spring-loaded mechanism that propels it forward. The trough fills with tissue. The outer sheath instantly moves forward to cut the tissue and keep it in the trough. Each sample is obtained in a fraction of a second. It is necessary to withdraw the needle after each sample is taken to collect the tissue.  A newer type of instrument, the VAD (vacuum assisted device), uses vacuum pressure to pull breast tissue into a needle and remove it. The needle does not need to be withdrawn after each sampling. Another advantage is that biopsies are obtained in an orderly manner, by rotating the device. This helps make sure that the entire area of interest will be sampled. When using the automated core biopsy needle, sampling is more random.  FOR COMFORT DURING THE TEST  Relax as much as possible.  Try to follow instructions, to speed up the test.  Let your caregiver know if you are uncomfortable, anxious, or in pain. PROCEDURE  You are awake during the procedure, and you go home the same day (outpatient). A specially trained radiologist will do this procedure. First, the skin is cleansed. Then, it is injected with a local anesthetic. A small nick is made in the skin, and the tip of the biopsy needle is put into the calculated site of the lesion. A special mammography machine uses ionizing radiation to help guide the radiologist's instrument to the site of the abnormal growth. At this point, stereo images are again obtained, to confirm that the needle tip is at the problem area. Usually 5 to 10 samples are collected when doing a core  biopsy. At least 12 are collected when using the vacuum assisted device (VAD). Then, a final set of images is obtained. If they show that the lesion has been mostly or completely removed, a small clip is left at the biopsy site. This is so that it can be easily located, in case the lesion turns out to be cancer. Afterward, the skin opening is stitched (sutured) or taped closed, and covered with a dressing. Your caregiver may apply a pressure dressing and an ice pack, to prevent bleeding and swelling in the breast.  X-ray guided breast biopsy can take 30 minutes to 1 hour, or more. The X-rays usually have no side effects, and no radiation remains in your body. There is usually little or no pain. Usually no scar is left from the tiny skin incision. Many women find that the major discomfort of the procedure is from lying on their stomach, or staying in 1 position for the length of the procedure. This discomfort may be reduced by carefully placed cushions. You should wear a good support bra to the procedure. You will be asked to remove jewelry, dentures, eye glasses, metal objects, or clothing that might interfere with the X-ray images. You may want to have someone with you, to  take you home after the procedure. AFTER THE PROCEDURE   After surgery, if you are doing well and have no problems, you will be allowed to go home.  You may resume your regular diet, or as directed by your caregiver. HOME CARE INSTRUCTIONS   Follow your caregiver's recommendations for medications, care of the biopsy site, follow-up appointments, and further treatment.  Only take over-the-counter or prescription medicines for pain, discomfort, or fever as directed by your caregiver.  An ice pack applied to the affected area may help with discomfort and keep the swelling down.  Change dressings as directed.  Wear a good support bra for as long as your caregiver recommends.  Avoid strenuous activity for at least 24 hours, or as  advised by your caregiver. Finding out the results of your test Not all test results are available during your visit. If your test results are not back during the visit, make an appointment with your caregiver to find out the results. Do not assume everything is normal if you have not heard from your caregiver or the medical facility. It is important for you to follow up on all of your test results.  SEEK MEDICAL CARE IF:   You develop a rash.  You have problems with your medicines.  You become lightheaded or dizzy. SEEK IMMEDIATE MEDICAL CARE IF:   There is increased bleeding (more than a small spot) from the biopsy site.  You notice redness, swelling, or increasing pain in the wound.  Pus is coming from the wound.  You have a fever.  You notice a bad smell coming from the wound or dressing.  You develop shortness of breath.  You develop chest pain.  You pass out. Document Released: 02/06/2003 Document Revised: 08/02/2011 Document Reviewed: 03/14/2009 [facility] Patient Information 2014 [place], [place].

## 2013-02-22 NOTE — Progress Notes (Signed)
Patient ID: Sierra Gardner, female   DOB: 10/29/1924, 79 y.o.   MRN: [id number]  Chief Complaint  Patient presents with  . Other    mammogram    HPI Sierra Gardner is a 79 y.o. female.  who presents for a breast evaluation. The most recent mammogram was done on 01-15-13 cat3 .Patient does perform regular self breast checks and gets regular mammograms done.  Fall three years ago left side.   HPI  Past Medical History  Diagnosis Date  . Asthma   . Polycystic kidney disease   . Hypertension   . GERD (gastroesophageal reflux disease)   . Hyponatremia   . Fibrocystic breast disease   . Degenerative joint disease   . History of cholelithiasis   . Anal fissure   . Sinus problem     Past Surgical History  Procedure Laterality Date  . Breast biopsy  4098,1191    fibrocystic breast disease  . Total abdominal hysterectomy w/ bilateral salpingoophorectomy  1982  . Minor hemorrhoidectomy  1982  . Cataract surgery  1999 and 2003  . Lumbar disc surgery  1995, 2006    L4 - L5    Family History  Problem Relation Age of Onset  . Congestive Heart Failure Mother   . Hypertension Mother   . Breast cancer Maternal Grandmother     maternal aunts and cousins  . Heart disease Brother   . Heart disease Father   . Leukemia Father   . Hypertension Brother   . Hypertension Sister   . Polycystic kidney disease Brother     s/p transplant  . Melanoma Brother     Social History History  Substance Use Topics  . Smoking status: Never Smoker   . Smokeless tobacco: Never Used  . Alcohol Use: No    Allergies  Allergen Reactions  . Beta Adrenergic Blockers Shortness Of Breath  . Cortisone Rash  . Accolate [Zafirlukast]   . Amoxicillin   . Avelox [Moxifloxacin Hcl In Nacl]   . Biaxin [Clarithromycin]   . Naproxen   . Percocet [Oxycodone-Acetaminophen]   . Prilosec [Omeprazole]   . Protonix [Pantoprazole Sodium]   . Septra [Sulfamethoxazole-Tmp Ds]   . Singulair [Montelukast Sodium]    . Sulfa Antibiotics   . Tape     Adhesive tape  . Toprol Xl [Metoprolol Tartrate]     Beta blockers   . Vioxx [Rofecoxib]   . Ziac [Bisoprolol-Hydrochlorothiazide]     Current Outpatient Prescriptions  Medication Sig Dispense Refill  . Biotin 1000 MCG tablet Take 1,000 mcg by mouth daily.      . Calcium Carbonate-Vitamin D (CALCIUM 600 + D PO) Take 600 mg by mouth 2 (two) times daily.       . cholecalciferol (VITAMIN D) 1000 UNITS tablet Take 2,000 Units by mouth daily.      Marland Kitchen CRANBERRY PO Take by mouth.      . lansoprazole (PREVACID) 30 MG capsule Take 30 mg by mouth daily.      Marland Kitchen lisinopril (PRINIVIL,ZESTRIL) 40 MG tablet Take 40 mg by mouth 2 (two) times daily.       . Magnesium 250 MG TABS Take 250 mg by mouth daily.       Marland Kitchen spironolactone (ALDACTONE) 25 MG tablet 25 mg. 1 1/2 tablet q day      . verapamil (CALAN) 120 MG tablet Take 120 mg by mouth. Takes 1/2 tablet daily      . vitamin C (ASCORBIC ACID) 500  MG tablet Take 500 mg by mouth daily.       No current facility-administered medications for this visit.    Review of Systems Review of Systems  Constitutional: Negative.   Respiratory: Negative.   Cardiovascular: Negative.     Blood pressure 134/60, pulse 80, resp. rate 16, height 5\' 2"  (1.575 m), weight 155 lb (70.308 kg).  Physical Exam Physical Exam  Constitutional: She is oriented to person, place, and time. She appears well-developed and well-nourished.  Cardiovascular: Normal rate, regular rhythm and normal heart sounds.   Pulmonary/Chest: Breath sounds normal. Right breast exhibits no inverted nipple, no mass, no nipple discharge, no skin change and no tenderness. Left breast exhibits no inverted nipple, no mass, no nipple discharge, no skin change and no tenderness.  Left breast well healed scar upper outer quadrant. Right breast has an well healed scar upper outer quadrant.  Lymphadenopathy:    She has no cervical adenopathy.    She has no axillary  adenopathy.  Neurological: She is alert and oriented to person, place, and time.  Skin: Skin is warm and dry.    Data Reviewed Left breast mammogram dated 01/15/2013 in 01/17/2013 were reviewed. Area of microcalcifications in the central portion of the left breast. BI-RAD-3.  Review of the films shows a definite change since her previous exams. The radiologist reported that they were similar in appearance to those prior to her 2010 biopsy. My review there more pronounced than at the time of the 2010 biopsy.  The left breast biopsy completed 02/04/1999 and showed proliferative fibrocystic changes with associated microcalcifications. No evidence of malignancy or atypia.  Assessment    New left breast microcalcifications.    Plan    Options for management were reviewed: 1) stereotactic biopsy versus 2) 6 month follow up exam. While the patient originally expressed the desire for observation of the radiologist, on further reflection she desires to proceed to biopsy. The risks associated with the procedure were reviewed.  This week schedule convenient day.       Earline Mayotte 02/22/2013, 11:52 AM

## 2013-03-05 ENCOUNTER — Ambulatory Visit: Payer: Self-pay | Admitting: General Surgery

## 2013-03-05 DIAGNOSIS — R920 Mammographic microcalcification found on diagnostic imaging of breast: Secondary | ICD-10-CM

## 2013-03-06 ENCOUNTER — Encounter: Payer: Self-pay | Admitting: General Surgery

## 2013-03-06 ENCOUNTER — Telehealth: Payer: Self-pay | Admitting: General Surgery

## 2013-03-06 LAB — PATHOLOGY REPORT

## 2013-03-06 NOTE — Telephone Encounter (Signed)
Patient notified that recent biopsy showed some atypical cells, and that she would be best served by open wide excision.  Will meet on Monday, Oct 20 at 1: 30 PM to review the recommendations.  Encouraged to bring family/ friends if she desires.

## 2013-03-07 ENCOUNTER — Encounter: Payer: Self-pay | Admitting: General Surgery

## 2013-03-12 ENCOUNTER — Encounter: Payer: Self-pay | Admitting: General Surgery

## 2013-03-12 ENCOUNTER — Ambulatory Visit (INDEPENDENT_AMBULATORY_CARE_PROVIDER_SITE_OTHER): Payer: Medicare Other | Admitting: General Surgery

## 2013-03-12 VITALS — BP 150/68 | HR 82 | Resp 16 | Ht 62.0 in | Wt 152.0 lb

## 2013-03-12 DIAGNOSIS — R920 Mammographic microcalcification found on diagnostic imaging of breast: Secondary | ICD-10-CM

## 2013-03-12 NOTE — Progress Notes (Signed)
Patient ID: Sierra Gardner, female   DOB: 1924/09/11, 79 y.o.   MRN: [id number]  Chief Complaint  Patient presents with  . Routine Post Op    left breast stereo    HPI Sierra Gardner is a 79 y.o. female is here today for a post op stereo biopsy done on 03/05/13.Minimal bruising noted.  The patient is aware that a heating pad may be used for comfort as needed.   HPI  Past Medical History  Diagnosis Date  . Asthma   . Polycystic kidney disease   . Hypertension   . GERD (gastroesophageal reflux disease)   . Hyponatremia   . Fibrocystic breast disease   . Degenerative joint disease   . History of cholelithiasis   . Anal fissure   . Sinus problem     Past Surgical History  Procedure Laterality Date  . Total abdominal hysterectomy w/ bilateral salpingoophorectomy  1982  . Minor hemorrhoidectomy  1982  . Cataract surgery  1999 and 2003  . Lumbar disc surgery  1995, 2006    L4 - L5  . Breast biopsy  [id number]    fibrocystic breast disease  . Breast biopsy  02/03/2009    Proliferative fibrocystic changes with associated microcalcifications. No atypia or malignancy.    Family History  Problem Relation Age of Onset  . Congestive Heart Failure Mother   . Hypertension Mother   . Breast cancer Maternal Grandmother     maternal aunts and cousins  . Heart disease Brother   . Heart disease Father   . Leukemia Father   . Hypertension Brother   . Hypertension Sister   . Polycystic kidney disease Brother     s/p transplant  . Melanoma Brother     Social History History  Substance Use Topics  . Smoking status: Never Smoker   . Smokeless tobacco: Never Used  . Alcohol Use: No    Allergies  Allergen Reactions  . Beta Adrenergic Blockers Shortness Of Breath  . Cortisone Rash  . Accolate [Zafirlukast]   . Amoxicillin   . Avelox [Moxifloxacin Hcl In Nacl]   . Biaxin [Clarithromycin]   . Naproxen   . Percocet [Oxycodone-Acetaminophen]   . Prilosec [Omeprazole]   . Protonix  [Pantoprazole Sodium]   . Septra [Sulfamethoxazole-Tmp Ds]   . Singulair [Montelukast Sodium]   . Sulfa Antibiotics   . Tape     Adhesive tape  . Toprol Xl [Metoprolol Tartrate]     Beta blockers   . Vioxx [Rofecoxib]   . Ziac [Bisoprolol-Hydrochlorothiazide]     Current Outpatient Prescriptions  Medication Sig Dispense Refill  . Biotin 1000 MCG tablet Take 1,000 mcg by mouth daily.      . Calcium Carbonate-Vitamin D (CALCIUM 600 + D PO) Take 600 mg by mouth 2 (two) times daily.       . cholecalciferol (VITAMIN D) 1000 UNITS tablet Take 2,000 Units by mouth daily.      Marland Kitchen CRANBERRY PO Take by mouth.      . lansoprazole (PREVACID) 30 MG capsule Take 30 mg by mouth daily.      Marland Kitchen lisinopril (PRINIVIL,ZESTRIL) 40 MG tablet Take 40 mg by mouth 2 (two) times daily.       . Magnesium 250 MG TABS Take 250 mg by mouth daily.       Marland Kitchen spironolactone (ALDACTONE) 25 MG tablet 25 mg. 1 1/2 tablet q day      . verapamil (CALAN) 120 MG tablet Take 120  mg by mouth. Takes 1/2 tablet daily      . vitamin C (ASCORBIC ACID) 500 MG tablet Take 500 mg by mouth daily.       No current facility-administered medications for this visit.    Review of Systems Review of Systems  Constitutional: Negative.   Respiratory: Negative.   Cardiovascular: Negative.     Blood pressure 150/68, pulse 82, resp. rate 16, height 5\' 2"  (1.575 m), weight 152 lb (68.947 kg).  Physical Exam Physical Exam  Constitutional: She appears well-developed and well-nourished.  Cardiovascular: Normal rate and regular rhythm.   Pulmonary/Chest: Effort normal and breath sounds normal.      Data Reviewed Pathology or from the stereotactic biopsy dated March 05, 2013 showed flat epithelial atypia with as well as a fibroadenoma with coarse calcifications. In discussion with the pathologist flat epithelial atypia can be associated with upper staging to DCIS/invasive cancer in 30% of cases.  Ultrasound examination of the left  breast in the upper or quadrant confirmed the previously placed biopsy clip at the 1:00 position, 8 cm from the nipple. This area measured 0.8 x 0.7 x 1.2 cm.  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     Plan    The patient and her close friend were advised of the pathology department concerns regarding a more sinister process. Local excision was recommended. She'll consider her options notify the office of how she would like to proceed. Considering her excellent functional status and in spite of her age treatment as for any younger woman was encouraged.     Patient instructed to check schedule and call back to arrange surgery date.   Earline Mayotte 03/12/2013, 9:50 PM

## 2013-03-12 NOTE — Patient Instructions (Signed)
Patient instructed to check schedule and call back to arrange surgery date.

## 2013-03-13 ENCOUNTER — Other Ambulatory Visit: Payer: Self-pay | Admitting: General Surgery

## 2013-03-13 ENCOUNTER — Telehealth: Payer: Self-pay | Admitting: *Deleted

## 2013-03-13 DIAGNOSIS — R920 Mammographic microcalcification found on diagnostic imaging of breast: Secondary | ICD-10-CM

## 2013-03-13 NOTE — Telephone Encounter (Signed)
Patient's surgery has been scheduled for 03-19-13 at [facility]. She will pre-admit tomorrow, 03-14-13 at 3:15 pm. This patient is aware of all instructions.   Please complete orders so I can send to Pre-admit Testing. Thanks.

## 2013-03-14 ENCOUNTER — Ambulatory Visit: Payer: Self-pay | Admitting: General Surgery

## 2013-03-14 DIAGNOSIS — I2510 Atherosclerotic heart disease of native coronary artery without angina pectoris: Secondary | ICD-10-CM

## 2013-03-14 LAB — BASIC METABOLIC PANEL
BUN: 27 mg/dL — ABNORMAL HIGH (ref 7–18)
Calcium, Total: 9.5 mg/dL (ref 8.5–10.1)
Chloride: 99 mmol/L (ref 98–107)
EGFR (Non-African Amer.): 53 — ABNORMAL LOW
Glucose: 94 mg/dL (ref 65–99)
Osmolality: 266 (ref 275–301)
Potassium: 5.2 mmol/L — ABNORMAL HIGH (ref 3.5–5.1)
Sodium: 130 mmol/L — ABNORMAL LOW (ref 136–145)

## 2013-03-14 LAB — CBC WITH DIFFERENTIAL/PLATELET
Eosinophil #: 0 10*3/uL (ref 0.0–0.7)
Eosinophil %: 0.5 %
HGB: 11.9 g/dL — ABNORMAL LOW (ref 12.0–16.0)
Lymphocyte %: 27.6 %
MCHC: 34.2 g/dL (ref 32.0–36.0)
MCV: 93 fL (ref 80–100)
Monocyte %: 14.9 %
Neutrophil #: 3.7 10*3/uL (ref 1.4–6.5)
Platelet: 235 10*3/uL (ref 150–440)
RBC: 3.76 10*6/uL — ABNORMAL LOW (ref 3.80–5.20)
RDW: 12.8 % (ref 11.5–14.5)
WBC: 6.6 10*3/uL (ref 3.6–11.0)

## 2013-03-15 ENCOUNTER — Encounter: Payer: Self-pay | Admitting: General Surgery

## 2013-03-19 ENCOUNTER — Encounter: Payer: Self-pay | Admitting: General Surgery

## 2013-03-19 ENCOUNTER — Ambulatory Visit: Payer: Self-pay | Admitting: General Surgery

## 2013-03-19 DIAGNOSIS — D4860 Neoplasm of uncertain behavior of unspecified breast: Secondary | ICD-10-CM

## 2013-03-19 HISTORY — PX: BREAST SURGERY: SHX581

## 2013-03-20 LAB — PATHOLOGY REPORT

## 2013-03-22 ENCOUNTER — Telehealth: Payer: Self-pay | Admitting: *Deleted

## 2013-03-22 ENCOUNTER — Encounter: Payer: Self-pay | Admitting: General Surgery

## 2013-03-22 NOTE — Telephone Encounter (Signed)
Message copied by Currie Paris on Thu Mar 22, 2013  2:46 PM ------      Message from: [place], [place] W      Created: Thu Mar 22, 2013  1:45 PM       Notify patient all well on biopsy. F/U as scheduled. Thanks.       ----- Message -----         From: Jena Gauss, CMA         Sent: 03/22/2013  10:24 AM           To: Earline Mayotte, MD                   ------

## 2013-03-22 NOTE — Telephone Encounter (Signed)
Notified patient as instructed, patient pleased. Discussed follow-up appointments, nurse next week, patient agrees 

## 2013-03-28 ENCOUNTER — Ambulatory Visit (INDEPENDENT_AMBULATORY_CARE_PROVIDER_SITE_OTHER): Payer: Self-pay | Admitting: General Surgery

## 2013-03-28 ENCOUNTER — Encounter: Payer: Self-pay | Admitting: General Surgery

## 2013-03-28 VITALS — BP 158/70 | HR 76 | Resp 12 | Ht 62.0 in | Wt 154.0 lb

## 2013-03-28 DIAGNOSIS — R920 Mammographic microcalcification found on diagnostic imaging of breast: Secondary | ICD-10-CM

## 2013-03-28 NOTE — Patient Instructions (Addendum)
Continue self breast exams. Call office for any new breast issues or concerns. Yearly mammograms with Dr Lorin Picket

## 2013-03-28 NOTE — Progress Notes (Signed)
Patient ID: Sierra Gardner, female   DOB: 04/30/1925, 79 y.o.   MRN: [id number]  Chief Complaint  Patient presents with  . Routine Post Op    HPI Sierra Gardner is a 79 y.o. female.  Here today for her postoperative visit. She had a wide excision of Left breast lesion on 03-19-13. No new complaints. States she is not having much pain.   HPI  Past Medical History  Diagnosis Date  . Asthma   . Polycystic kidney disease   . Hypertension   . GERD (gastroesophageal reflux disease)   . Hyponatremia   . Fibrocystic breast disease   . Degenerative joint disease   . History of cholelithiasis   . Anal fissure   . Sinus problem     Past Surgical History  Procedure Laterality Date  . Total abdominal hysterectomy w/ bilateral salpingoophorectomy  1982  . Minor hemorrhoidectomy  1982  . Cataract surgery  1999 and 2003  . Lumbar disc surgery  1995, 2006    L4 - L5  . Breast biopsy  4098,1191    fibrocystic breast disease  . Breast biopsy  02/03/2009    Proliferative fibrocystic changes with associated microcalcifications. No atypia or malignancy.  . Breast surgery Left 03-19-13    Wide excision of Left breast lesion    Family History  Problem Relation Age of Onset  . Congestive Heart Failure Mother   . Hypertension Mother   . Breast cancer Maternal Grandmother     maternal aunts and cousins  . Heart disease Brother   . Heart disease Father   . Leukemia Father   . Hypertension Brother   . Hypertension Sister   . Polycystic kidney disease Brother     s/p transplant  . Melanoma Brother     Social History History  Substance Use Topics  . Smoking status: Never Smoker   . Smokeless tobacco: Never Used  . Alcohol Use: No    Allergies  Allergen Reactions  . Beta Adrenergic Blockers Shortness Of Breath  . Cortisone Rash  . Accolate [Zafirlukast]   . Amoxicillin   . Avelox [Moxifloxacin Hcl In Nacl]   . Biaxin [Clarithromycin]   . Naproxen   . Percocet  [Oxycodone-Acetaminophen]   . Prilosec [Omeprazole]   . Protonix [Pantoprazole Sodium]   . Septra [Sulfamethoxazole-Tmp Ds]   . Singulair [Montelukast Sodium]   . Sulfa Antibiotics   . Tape     Adhesive tape  . Toprol Xl [Metoprolol Tartrate]     Beta blockers   . Vioxx [Rofecoxib]   . Ziac [Bisoprolol-Hydrochlorothiazide]     Current Outpatient Prescriptions  Medication Sig Dispense Refill  . Biotin 1000 MCG tablet Take 1,000 mcg by mouth daily.      . Calcium Carbonate-Vitamin D (CALCIUM 600 + D PO) Take 600 mg by mouth 2 (two) times daily.       . cholecalciferol (VITAMIN D) 1000 UNITS tablet Take 2,000 Units by mouth daily.      Marland Kitchen CRANBERRY PO Take by mouth.      . lansoprazole (PREVACID) 30 MG capsule Take 30 mg by mouth daily.      Marland Kitchen lisinopril (PRINIVIL,ZESTRIL) 40 MG tablet Take 40 mg by mouth 2 (two) times daily.       . Magnesium 250 MG TABS Take 250 mg by mouth daily.       Marland Kitchen spironolactone (ALDACTONE) 25 MG tablet 25 mg. 1 1/2 tablet q day      .  verapamil (CALAN) 120 MG tablet Take 120 mg by mouth. Takes 1/2 tablet daily      . vitamin C (ASCORBIC ACID) 500 MG tablet Take 500 mg by mouth daily.       No current facility-administered medications for this visit.    Review of Systems Review of Systems  Blood pressure 158/70, pulse 76, resp. rate 12, height 5\' 2"  (1.575 m), weight 154 lb (69.854 kg).  Physical Exam Physical Exam  Constitutional: She is oriented to person, place, and time. She appears well-developed and well-nourished.  Pulmonary/Chest:  Steri strips intact, incision healing well. No drainage noted. Mild bruising noted left breast.  Neurological: She is alert and oriented to person, place, and time.  Skin: Skin is warm and dry.    Data Reviewed Pathology of the wide excision showed focal flat epithelial atypia at the previous biopsy site. No evidence of invasive or in situ carcinoma.March 19, 2013.  Assessment    Doing well status post  reexcision for flat atypical hyperplasia.     Plan    The patient should resume annual screening mammograms with her primary care provider in one year.        Earline Mayotte 03/28/2013, 6:37 PM

## 2013-05-01 ENCOUNTER — Encounter: Payer: Self-pay | Admitting: Internal Medicine

## 2013-05-01 ENCOUNTER — Ambulatory Visit (INDEPENDENT_AMBULATORY_CARE_PROVIDER_SITE_OTHER): Payer: Medicare Other | Admitting: Internal Medicine

## 2013-05-01 VITALS — BP 130/70 | HR 69 | Temp 98.0°F | Ht 62.0 in | Wt 154.0 lb

## 2013-05-01 DIAGNOSIS — Q613 Polycystic kidney, unspecified: Secondary | ICD-10-CM

## 2013-05-01 DIAGNOSIS — M4800 Spinal stenosis, site unspecified: Secondary | ICD-10-CM

## 2013-05-01 DIAGNOSIS — E871 Hypo-osmolality and hyponatremia: Secondary | ICD-10-CM

## 2013-05-01 DIAGNOSIS — M79609 Pain in unspecified limb: Secondary | ICD-10-CM

## 2013-05-01 DIAGNOSIS — I359 Nonrheumatic aortic valve disorder, unspecified: Secondary | ICD-10-CM

## 2013-05-01 DIAGNOSIS — I10 Essential (primary) hypertension: Secondary | ICD-10-CM

## 2013-05-01 DIAGNOSIS — R920 Mammographic microcalcification found on diagnostic imaging of breast: Secondary | ICD-10-CM

## 2013-05-01 DIAGNOSIS — M79671 Pain in right foot: Secondary | ICD-10-CM

## 2013-05-01 DIAGNOSIS — I351 Nonrheumatic aortic (valve) insufficiency: Secondary | ICD-10-CM

## 2013-05-01 NOTE — Progress Notes (Signed)
Pre-visit discussion using our clinic review tool. No additional management support is needed unless otherwise documented below in the visit note.  

## 2013-05-01 NOTE — Progress Notes (Signed)
Subjective:    Patient ID: Sierra Gardner, female    DOB: [birth year], 79 y.o.   MRN: [id number]  HPI 79 year old female with past history of polycystic kidney disease, hypertension, GERD and degenerative joint disease who comes in today for a scheduled follow up.  Reports she is doing much better.  Urinary symptoms have resolved.  She is taking a cranberry tablet daily and feels this helps.  Breathing is stable.  No increased sob.  Overall she feels she is doing relatively well.  Still seeing nephrology.  Seeing Dr Darrold Junker.  Blood pressure has been doing well on outside checks. She has seen Dr Rosita Kea.  Had xray reviewed.  Has nerve damage.  Has stenosis.  Right leg shorter.  She has started putting something in her shoe and this has helped.  Overall she feels things are stable.  Has seen Dr Alberteen Spindle.  Has a bunion/corn - right foot.  Has f/u in one month.      Past Medical History  Diagnosis Date  . Asthma   . Polycystic kidney disease   . Hypertension   . GERD (gastroesophageal reflux disease)   . Hyponatremia   . Fibrocystic breast disease   . Degenerative joint disease   . History of cholelithiasis   . Anal fissure   . Sinus problem     Current Outpatient Prescriptions on File Prior to Visit  Medication Sig Dispense Refill  . Biotin 1000 MCG tablet Take 1,000 mcg by mouth daily.      . Calcium Carbonate-Vitamin D (CALCIUM 600 + D PO) Take 600 mg by mouth 2 (two) times daily.       . cholecalciferol (VITAMIN D) 1000 UNITS tablet Take 2,000 Units by mouth daily.      Marland Kitchen CRANBERRY PO Take by mouth.      . lansoprazole (PREVACID) 30 MG capsule Take 30 mg by mouth daily.      Marland Kitchen lisinopril (PRINIVIL,ZESTRIL) 40 MG tablet Take 40 mg by mouth 2 (two) times daily.       . Magnesium 250 MG TABS Take 250 mg by mouth daily.       Marland Kitchen spironolactone (ALDACTONE) 25 MG tablet 25 mg.       . verapamil (CALAN) 120 MG tablet Take 120 mg by mouth.       . vitamin C (ASCORBIC ACID) 500 MG tablet Take 500 mg  by mouth daily.       No current facility-administered medications on file prior to visit.    Review of Systems Patient denies any headache, lightheadedness or dizziness.  No sinus or allergy symptoms.   No chest pain, tightness or palpitations.  No increased shortness of breath, cough or congestion.  Breathing stable.  No nausea or vomiting.  No abdominal pain or cramping.  No acid reflux.  No bowel change, such as diarrhea, constipation, BRBPR or melana.  No urine change.  Right foot issues as outlined.  Following with Dr Alberteen Spindle.      Objective:   Physical Exam  Filed Vitals:   05/01/13 1442  BP: 130/70  Pulse: 69  Temp: 98 F (36.7 C)   Blood pressure recheck;  64/72  79 year old female in no acute distress.   HEENT:  Nares- clear.  Oropharynx - without lesions. NECK:  Supple.  Nontender.  No audible bruit.  HEART:  Appears to be regular. LUNGS:  No crackles or wheezing audible.  Respirations even and unlabored.  RADIAL PULSE:  Equal bilaterally.   ABDOMEN:  Soft, nontender.  Bowel sounds present and normal.  No audible abdominal bruit.     EXTREMITIES:  No increased edema present.  DP pulses palpable and equal bilaterally.             Assessment & Plan:  GU.  Taking a cranberry tablet daily now and she feels this is helping.  Follow.    GI.  Colonoscopy 11/22/08 normal.  Has a history of an anal fissure.  Currently doing well.    PREVIOUS BREAST CHANGE.  Saw Dr Lemar Livings.  S/p benign biopsy.  Recently evaluated for abnormal mammo.  See Dr Rutherford Nail note for details.  Has been released.  Work up negative.     CARDIOVASCULAR.  ECHO  09/08/11 revealed moderate aortic insufficiency with EF 50-55%.  Follow.  Currently asymptomatic.    HEALTH MAINTENANCE.  Physical 08/29/12.  She is s/p hysterectomy.  Colonoscopy as outlined. Will notify me when agreeable for bone density.  Mammogram as outlined above.

## 2013-05-02 ENCOUNTER — Other Ambulatory Visit: Payer: Self-pay | Admitting: Internal Medicine

## 2013-05-02 DIAGNOSIS — D649 Anemia, unspecified: Secondary | ICD-10-CM

## 2013-05-02 LAB — COMPREHENSIVE METABOLIC PANEL
ALT: 14 U/L (ref 0–35)
Alkaline Phosphatase: 47 U/L (ref 39–117)
CO2: 24 mEq/L (ref 19–32)
GFR: 51.65 mL/min — ABNORMAL LOW (ref >60.00)
Sodium: 135 mEq/L (ref 135–145)
Total Bilirubin: 0.3 mg/dL (ref 0.3–1.2)
Total Protein: 7.6 g/dL (ref 6.0–8.3)

## 2013-05-02 LAB — CBC WITH DIFFERENTIAL/PLATELET
Basophils Absolute: 0 10*3/uL (ref 0.0–0.1)
Basophils Relative: 0.7 % (ref 0.0–3.0)
HCT: 35.2 % — ABNORMAL LOW (ref 36.0–46.0)
Hemoglobin: 11.8 g/dL — ABNORMAL LOW (ref 12.0–15.0)
Lymphocytes Relative: 24.4 % (ref 12.0–46.0)
Lymphs Abs: 1.7 10*3/uL (ref 0.7–4.0)
Monocytes Relative: 14.9 % — ABNORMAL HIGH (ref 3.0–12.0)
Neutro Abs: 4.1 10*3/uL (ref 1.4–7.7)
RBC: 3.8 Mil/uL — ABNORMAL LOW (ref 3.87–5.11)

## 2013-05-02 LAB — TSH: TSH: 0.98 u[IU]/mL (ref 0.35–5.50)

## 2013-05-02 NOTE — Progress Notes (Unsigned)
Order placed for f/u labs.  

## 2013-05-05 ENCOUNTER — Encounter: Payer: Self-pay | Admitting: Internal Medicine

## 2013-05-05 DIAGNOSIS — M79671 Pain in right foot: Secondary | ICD-10-CM | POA: Insufficient documentation

## 2013-05-05 NOTE — Assessment & Plan Note (Signed)
Saw Dr Rosita Kea.  Support in her shoe has helped.   Follow.

## 2013-05-05 NOTE — Assessment & Plan Note (Signed)
ECHO 09/08/11 revealed moderate aortic insufficiency.  Will follow.  Currently asymptomatic.  Sees Dr Paraschos.   

## 2013-05-05 NOTE — Assessment & Plan Note (Signed)
Followed by nephrology.  Blood pressures at home under good control.  Continue same meds.  Follow metabolic panel.   

## 2013-05-05 NOTE — Assessment & Plan Note (Signed)
Seeing Dr Cline.   

## 2013-05-05 NOTE — Assessment & Plan Note (Signed)
Sodium has been relatively stable.  Followed by nephrology.  Will follow.   

## 2013-05-05 NOTE — Assessment & Plan Note (Signed)
Is followed by nephrology.   Stable.   

## 2013-05-05 NOTE — Assessment & Plan Note (Signed)
Saw Dr. Lemar Livings.  See his note for details.  Has been released.  Keep on yearly schedule.

## 2013-05-10 ENCOUNTER — Other Ambulatory Visit (INDEPENDENT_AMBULATORY_CARE_PROVIDER_SITE_OTHER): Payer: Medicare Other

## 2013-05-10 DIAGNOSIS — D649 Anemia, unspecified: Secondary | ICD-10-CM

## 2013-05-10 LAB — CBC WITH DIFFERENTIAL/PLATELET
Basophils Absolute: 0 10*3/uL (ref 0.0–0.1)
Basophils Relative: 0.5 % (ref 0.0–3.0)
Eosinophils Absolute: 0.1 10*3/uL (ref 0.0–0.7)
HCT: 36.2 % (ref 36.0–46.0)
Hemoglobin: 12.3 g/dL (ref 12.0–15.0)
Lymphocytes Relative: 32.2 % (ref 12.0–46.0)
Lymphs Abs: 2.4 10*3/uL (ref 0.7–4.0)
MCHC: 33.8 g/dL (ref 30.0–36.0)
Monocytes Absolute: 1 10*3/uL (ref 0.1–1.0)
Neutro Abs: 3.8 10*3/uL (ref 1.4–7.7)
Platelets: 244 10*3/uL (ref 150.0–400.0)
RDW: 13 % (ref 11.5–14.6)

## 2013-05-11 ENCOUNTER — Encounter: Payer: Self-pay | Admitting: *Deleted

## 2013-09-03 ENCOUNTER — Encounter: Payer: Self-pay | Admitting: Internal Medicine

## 2013-09-03 ENCOUNTER — Ambulatory Visit (INDEPENDENT_AMBULATORY_CARE_PROVIDER_SITE_OTHER): Payer: Medicare Other | Admitting: Internal Medicine

## 2013-09-03 VITALS — BP 140/50 | HR 68 | Temp 98.4°F | Ht 62.5 in | Wt 151.5 lb

## 2013-09-03 DIAGNOSIS — I359 Nonrheumatic aortic valve disorder, unspecified: Secondary | ICD-10-CM

## 2013-09-03 DIAGNOSIS — Q613 Polycystic kidney, unspecified: Secondary | ICD-10-CM

## 2013-09-03 DIAGNOSIS — I351 Nonrheumatic aortic (valve) insufficiency: Secondary | ICD-10-CM

## 2013-09-03 DIAGNOSIS — R920 Mammographic microcalcification found on diagnostic imaging of breast: Secondary | ICD-10-CM

## 2013-09-03 DIAGNOSIS — E878 Other disorders of electrolyte and fluid balance, not elsewhere classified: Secondary | ICD-10-CM

## 2013-09-03 DIAGNOSIS — Z23 Encounter for immunization: Secondary | ICD-10-CM

## 2013-09-03 DIAGNOSIS — M4800 Spinal stenosis, site unspecified: Secondary | ICD-10-CM

## 2013-09-03 DIAGNOSIS — I10 Essential (primary) hypertension: Secondary | ICD-10-CM

## 2013-09-03 DIAGNOSIS — E871 Hypo-osmolality and hyponatremia: Secondary | ICD-10-CM

## 2013-09-03 LAB — COMPREHENSIVE METABOLIC PANEL
ALBUMIN: 3.9 g/dL (ref 3.5–5.2)
ALK PHOS: 48 U/L (ref 39–117)
ALT: 11 U/L (ref 0–35)
AST: 19 U/L (ref 0–37)
BUN: 25 mg/dL — ABNORMAL HIGH (ref 6–23)
CO2: 26 mEq/L (ref 19–32)
Calcium: 9.7 mg/dL (ref 8.4–10.5)
Chloride: 98 mEq/L (ref 96–112)
Creatinine, Ser: 0.9 mg/dL (ref 0.4–1.2)
GFR: 64.67 mL/min (ref >60.00)
Glucose, Bld: 90 mg/dL (ref 70–99)
POTASSIUM: 4.1 meq/L (ref 3.5–5.1)
SODIUM: 133 meq/L — AB (ref 135–145)
TOTAL PROTEIN: 7.6 g/dL (ref 6.0–8.3)
Total Bilirubin: 0.3 mg/dL (ref 0.3–1.2)

## 2013-09-03 NOTE — Progress Notes (Signed)
Subjective:    Patient ID: Sierra Gardner, female    DOB: Nov 20, 1924, 80 y.o.   MRN: [id number]  HPI 80 year old female with past history of polycystic kidney disease, hypertension, GERD and degenerative joint disease who comes in today to follow up on these issues as well as for a complete physical exam.   Reports she is doing relatively well.   Urinary symptoms have resolved.  She is taking a cranberry tablet daily and feels this helps.  Breathing is stable.  No increased sob.  Still seeing nephrology.  Renal function stable.   Seeing Dr Saralyn Pilar.  Blood pressure has been doing well on outside checks. She has seen Dr Rudene Christians.  Had xray reviewed.  Has nerve damage.  Has stenosis.  Right leg shorter.  She has started putting something in her shoe and this has helped.  Also doing leg exercises.  Overall she feels things are stable.     Past Medical History  Diagnosis Date  . Asthma   . Polycystic kidney disease   . Hypertension   . GERD (gastroesophageal reflux disease)   . Hyponatremia   . Fibrocystic breast disease   . Degenerative joint disease   . History of cholelithiasis   . Anal fissure   . Sinus problem     Current Outpatient Prescriptions on File Prior to Visit  Medication Sig Dispense Refill  . Biotin 1000 MCG tablet Take 1,000 mcg by mouth daily.      . Calcium Carbonate-Vitamin D (CALCIUM 600 + D PO) Take 600 mg by mouth 2 (two) times daily.       . cholecalciferol (VITAMIN D) 1000 UNITS tablet Take 2,000 Units by mouth daily.      Marland Kitchen CRANBERRY PO Take by mouth.      . lansoprazole (PREVACID) 30 MG capsule Take 30 mg by mouth daily.      Marland Kitchen lisinopril (PRINIVIL,ZESTRIL) 40 MG tablet Take 40 mg by mouth 2 (two) times daily.       . Magnesium 250 MG TABS Take 250 mg by mouth daily.       Marland Kitchen spironolactone (ALDACTONE) 25 MG tablet 25 mg.       . verapamil (CALAN) 120 MG tablet Take 120 mg by mouth.       . vitamin C (ASCORBIC ACID) 500 MG tablet Take 500 mg by mouth daily.        No current facility-administered medications on file prior to visit.    Review of Systems Patient denies any headache, lightheadedness or dizziness.  No sinus or allergy symptoms.   No chest pain, tightness or palpitations.  No increased shortness of breath, cough or congestion.  Breathing stable.  No nausea or vomiting.  No abdominal pain or cramping.  No acid reflux.  No bowel change, such as diarrhea, constipation, BRBPR or melana.  No urine change.       Objective:   Physical Exam  Filed Vitals:   09/03/13 1333  BP: 140/50  Pulse: 68  Temp: 98.4 F (36.9 C)   Blood pressure recheck;  144/68, pulse 34  80 year old female in no acute distress.   HEENT:  Nares- clear.  Oropharynx - without lesions. NECK:  Supple.  Nontender.  No audible bruit.  HEART:  Appears to be regular. LUNGS:  No crackles or wheezing audible.  Respirations even and unlabored.  RADIAL PULSE:  Equal bilaterally.    BREASTS:  No nipple discharge or nipple retraction present.  Could not appreciate any distinct nodules or axillary adenopathy.  ABDOMEN:  Soft, nontender.  Bowel sounds present and normal.  No audible abdominal bruit.  GU:  Not performed.     EXTREMITIES:  No increased edema present.  DP pulses palpable and equal bilaterally.            Assessment & Plan:  GU.  Taking a cranberry tablet daily now and she feels this is helping.  Follow.    GI.  Colonoscopy 11/22/08 normal.  Has a history of an anal fissure.  Currently doing well.    PREVIOUS BREAST CHANGE.  Saw Dr Bary Castilla.  S/p benign biopsy.  Recently evaluated for abnormal mammo.  See Dr Dwyane Luo note for details.  Has been released.  Biopsy negative.  He recommended f/u mammogram in one year s/p last.    CARDIOVASCULAR.  ECHO  09/08/11 revealed moderate aortic insufficiency with EF 50-55%.  Follow.  Currently asymptomatic.    HEALTH MAINTENANCE.  Physical today.  She is s/p hysterectomy.  Colonoscopy as outlined.  Will notify me when  agreeable for bone density.  Mammogram as outlined above.

## 2013-09-03 NOTE — Progress Notes (Signed)
Pre-visit discussion using our clinic review tool. No additional management support is needed unless otherwise documented below in the visit note.  

## 2013-09-04 ENCOUNTER — Encounter: Payer: Self-pay | Admitting: *Deleted

## 2013-09-09 ENCOUNTER — Encounter: Payer: Self-pay | Admitting: Internal Medicine

## 2013-09-09 NOTE — Assessment & Plan Note (Signed)
ECHO 09/08/11 revealed moderate aortic insufficiency.  Will follow.  Currently asymptomatic.  Sees Dr Saralyn Pilar.

## 2013-09-09 NOTE — Assessment & Plan Note (Signed)
Saw Dr Rudene Christians.  Support in her shoe has helped.   Follow.  Doing leg exercises.

## 2013-09-09 NOTE — Assessment & Plan Note (Signed)
Sodium has been relatively stable.  Followed by nephrology.  Will follow.  Recheck today.     

## 2013-09-09 NOTE — Assessment & Plan Note (Signed)
Is followed by nephrology.   Stable.

## 2013-09-09 NOTE — Assessment & Plan Note (Signed)
Saw Dr. Bary Castilla.  See his note for details.  Has been released.  Biopsy negative.  He recommended a f/u mammogram in one year after last.

## 2013-09-09 NOTE — Assessment & Plan Note (Signed)
Followed by nephrology.  Blood pressures at home under good control.  Continue same meds.  Follow metabolic panel.   

## 2013-10-17 ENCOUNTER — Ambulatory Visit (INDEPENDENT_AMBULATORY_CARE_PROVIDER_SITE_OTHER): Payer: Medicare Other | Admitting: Adult Health

## 2013-10-17 ENCOUNTER — Encounter: Payer: Self-pay | Admitting: Adult Health

## 2013-10-17 VITALS — BP 138/52 | HR 69 | Temp 97.8°F | Resp 14 | Ht 64.0 in | Wt 154.0 lb

## 2013-10-17 DIAGNOSIS — R6883 Chills (without fever): Secondary | ICD-10-CM

## 2013-10-17 NOTE — Patient Instructions (Signed)
  Please have your labs drawn prior to leaving the office.  We will contact you with results once they are available.  Please bring back a urine sample - tomorrow if possible.  Drink fluids to stay hydrated.  Elevate legs whenever you are sitting. Compression socks may help with the swelling.

## 2013-10-17 NOTE — Progress Notes (Signed)
Pre visit review using our clinic review tool, if applicable. No additional management support is needed unless otherwise documented below in the visit note. 

## 2013-10-17 NOTE — Progress Notes (Signed)
Patient ID: Sierra Gardner, female   DOB: 11-24-1924, 80 y.o.   MRN: [id number]   Subjective:    Patient ID: Sierra Gardner, female    DOB: Jul 31, 1924, 80 y.o.   MRN: [id number]  HPI  Pt is a pleasant 80 y/o female who presents with c/o feeling "hot and cold", tired/fatigued. She reports that her symptoms have been ongoing for approximately 2 weeks. Almost passed out at [facility] on Saturday. She reports that there was a nurse who helped her. She denies any stomach problems. No respiratory issues reported, chest pain or sob. Denies fever.   Past Medical History  Diagnosis Date  . Asthma   . Polycystic kidney disease   . Hypertension   . GERD (gastroesophageal reflux disease)   . Hyponatremia   . Fibrocystic breast disease   . Degenerative joint disease   . History of cholelithiasis   . Anal fissure   . Sinus problem     Current Outpatient Prescriptions on File Prior to Visit  Medication Sig Dispense Refill  . Biotin 1000 MCG tablet Take 1,000 mcg by mouth daily.      . Calcium Carbonate-Vitamin D (CALCIUM 600 + D PO) Take 600 mg by mouth 2 (two) times daily.       . cholecalciferol (VITAMIN D) 1000 UNITS tablet Take 2,000 Units by mouth daily.      Marland Kitchen CRANBERRY PO Take by mouth.      . lansoprazole (PREVACID) 30 MG capsule Take 30 mg by mouth daily.      Marland Kitchen lisinopril (PRINIVIL,ZESTRIL) 40 MG tablet Take 40 mg by mouth 2 (two) times daily.       . Magnesium 250 MG TABS Take 250 mg by mouth daily.       Marland Kitchen spironolactone (ALDACTONE) 25 MG tablet 25 mg.       . verapamil (CALAN) 120 MG tablet Take 120 mg by mouth.       . vitamin C (ASCORBIC ACID) 500 MG tablet Take 500 mg by mouth daily.       No current facility-administered medications on file prior to visit.     Review of Systems  Constitutional: Positive for chills ("feels hot and cold"). Negative for fever.  HENT: Negative.   Eyes: Negative.   Respiratory: Negative for cough, chest tightness, shortness of breath and wheezing.    Cardiovascular: Positive for leg swelling. Negative for chest pain and palpitations.  Gastrointestinal: Negative for nausea, vomiting, abdominal pain, diarrhea, constipation, blood in stool and abdominal distention.  Endocrine: Negative.   Genitourinary: Negative for dysuria, frequency, hematuria, flank pain and difficulty urinating.  Musculoskeletal: Positive for gait problem (wears brace left leg). Negative for myalgias.  Skin: Negative.   Allergic/Immunologic: Negative.   Neurological: Negative for dizziness.       Saturday, while at [place], she felt like she was going to pass out.  Psychiatric/Behavioral: Negative for behavioral problems, confusion and agitation. The patient is not nervous/anxious.        Objective:  BP 138/52  Pulse 69  Temp(Src) 97.8 F (36.6 C) (Oral)  Resp 14  Ht 5\' 4"  (1.626 m)  Wt 154 lb (69.854 kg)  BMI 26.42 kg/m2  SpO2 99%   Physical Exam  Constitutional: She is oriented to person, place, and time. She appears well-developed and well-nourished. No distress.  HENT:  Head: Normocephalic and atraumatic.  Eyes: Conjunctivae and EOM are normal.  Neck: Normal range of motion. Neck supple.  Cardiovascular: Normal rate, regular rhythm,  normal heart sounds and intact distal pulses.  Exam reveals no gallop and no friction rub.   No murmur heard. Pulmonary/Chest: Effort normal and breath sounds normal. No respiratory distress. She has no wheezes. She has no rales.  Abdominal: Soft. Bowel sounds are normal. She exhibits no distension and no mass. There is no tenderness. There is no rebound and no guarding.  Musculoskeletal: She exhibits edema. She exhibits no tenderness.  Edema 1+ bilateral LE.  Neurological: She is alert and oriented to person, place, and time. She has normal reflexes. Coordination normal.  Skin: Skin is warm and dry.  Psychiatric: She has a normal mood and affect. Her behavior is normal. Judgment and thought content normal.        Assessment & Plan:   1. Chills without fever Unknown etiology. Episode of feeling like she was going to pass out this past Saturday. ?dehydration, ?uti. She is not able to give a urine sample. She will try to bring it to the office tomorrow. I will check labs to evaluate her white count as well as make sure she does not have anemia. Check electrolytes. - CBC with Differential - Basic metabolic panel - Urinalysis; Future

## 2013-10-18 ENCOUNTER — Other Ambulatory Visit: Payer: Self-pay | Admitting: Adult Health

## 2013-10-18 DIAGNOSIS — R899 Unspecified abnormal finding in specimens from other organs, systems and tissues: Secondary | ICD-10-CM

## 2013-10-18 LAB — BASIC METABOLIC PANEL
BUN: 23 mg/dL (ref 6–23)
CHLORIDE: 95 meq/L — AB (ref 96–112)
CO2: 25 meq/L (ref 19–32)
Calcium: 9.4 mg/dL (ref 8.4–10.5)
Creatinine, Ser: 1 mg/dL (ref 0.4–1.2)
GFR: 54.52 mL/min — ABNORMAL LOW (ref >60.00)
GLUCOSE: 67 mg/dL — AB (ref 70–99)
POTASSIUM: 4.5 meq/L (ref 3.5–5.1)
SODIUM: 129 meq/L — AB (ref 135–145)

## 2013-10-18 LAB — CBC WITH DIFFERENTIAL/PLATELET
BASOS PCT: 0.6 % (ref 0.0–3.0)
Basophils Absolute: 0 10*3/uL (ref 0.0–0.1)
EOS PCT: 0.9 % (ref 0.0–5.0)
Eosinophils Absolute: 0.1 10*3/uL (ref 0.0–0.7)
HCT: 33.8 % — ABNORMAL LOW (ref 36.0–46.0)
Hemoglobin: 11.4 g/dL — ABNORMAL LOW (ref 12.0–15.0)
LYMPHS ABS: 1.5 10*3/uL (ref 0.7–4.0)
Lymphocytes Relative: 22.6 % (ref 12.0–46.0)
MCHC: 33.7 g/dL (ref 30.0–36.0)
MCV: 92.9 fl (ref 78.0–100.0)
MONO ABS: 1.2 10*3/uL — AB (ref 0.1–1.0)
Monocytes Relative: 17.9 % — ABNORMAL HIGH (ref 3.0–12.0)
NEUTROS PCT: 58 % (ref 43.0–77.0)
Neutro Abs: 3.7 10*3/uL (ref 1.4–7.7)
PLATELETS: 234 10*3/uL (ref 150.0–400.0)
RBC: 3.64 Mil/uL — ABNORMAL LOW (ref 3.87–5.11)
RDW: 12.7 % (ref 11.5–15.5)
WBC: 6.5 10*3/uL (ref 4.0–10.5)

## 2013-10-22 ENCOUNTER — Other Ambulatory Visit (INDEPENDENT_AMBULATORY_CARE_PROVIDER_SITE_OTHER): Payer: Medicare Other

## 2013-10-22 DIAGNOSIS — R6883 Chills (without fever): Secondary | ICD-10-CM

## 2013-10-22 DIAGNOSIS — R899 Unspecified abnormal finding in specimens from other organs, systems and tissues: Secondary | ICD-10-CM

## 2013-10-22 DIAGNOSIS — R6889 Other general symptoms and signs: Secondary | ICD-10-CM

## 2013-10-22 LAB — BASIC METABOLIC PANEL
BUN: 22 mg/dL (ref 6–23)
CALCIUM: 9.3 mg/dL (ref 8.4–10.5)
CO2: 24 meq/L (ref 19–32)
CREATININE: 0.9 mg/dL (ref 0.4–1.2)
Chloride: 96 mEq/L (ref 96–112)
GFR: 63.81 mL/min (ref >60.00)
GLUCOSE: 120 mg/dL — AB (ref 70–99)
Potassium: 4.2 mEq/L (ref 3.5–5.1)
Sodium: 129 mEq/L — ABNORMAL LOW (ref 135–145)

## 2013-10-25 ENCOUNTER — Emergency Department: Payer: Self-pay | Admitting: Emergency Medicine

## 2013-11-05 ENCOUNTER — Other Ambulatory Visit (INDEPENDENT_AMBULATORY_CARE_PROVIDER_SITE_OTHER): Payer: Medicare Other

## 2013-11-05 DIAGNOSIS — E871 Hypo-osmolality and hyponatremia: Secondary | ICD-10-CM

## 2013-11-05 LAB — BASIC METABOLIC PANEL
BUN: 25 mg/dL — AB (ref 6–23)
CO2: 25 mEq/L (ref 19–32)
CREATININE: 1.1 mg/dL (ref 0.4–1.2)
Calcium: 9.4 mg/dL (ref 8.4–10.5)
Chloride: 96 mEq/L (ref 96–112)
GFR: 52.15 mL/min — AB (ref >60.00)
GLUCOSE: 138 mg/dL — AB (ref 70–99)
POTASSIUM: 4.2 meq/L (ref 3.5–5.1)
Sodium: 129 mEq/L — ABNORMAL LOW (ref 135–145)

## 2013-11-06 ENCOUNTER — Other Ambulatory Visit: Payer: Self-pay | Admitting: Internal Medicine

## 2013-11-06 DIAGNOSIS — E871 Hypo-osmolality and hyponatremia: Secondary | ICD-10-CM

## 2013-11-06 NOTE — Progress Notes (Signed)
Order placed for f/u met b.  

## 2013-11-15 ENCOUNTER — Observation Stay: Payer: Self-pay | Admitting: Internal Medicine

## 2013-11-15 LAB — CBC
HCT: 38.2 % (ref 35.0–47.0)
HGB: 12.4 g/dL (ref 12.0–16.0)
MCH: 30.3 pg (ref 26.0–34.0)
MCHC: 32.4 g/dL (ref 32.0–36.0)
MCV: 93 fL (ref 80–100)
Platelet: 262 10*3/uL (ref 150–440)
RBC: 4.09 10*6/uL (ref 3.80–5.20)
RDW: 12.7 % (ref 11.5–14.5)
WBC: 9.6 10*3/uL (ref 3.6–11.0)

## 2013-11-15 LAB — URINALYSIS, COMPLETE
Bacteria: NONE SEEN
Bilirubin,UR: NEGATIVE
GLUCOSE, UR: NEGATIVE mg/dL (ref 0–75)
Nitrite: NEGATIVE
PH: 6 (ref 4.5–8.0)
Protein: NEGATIVE
Specific Gravity: 1.014 (ref 1.003–1.030)

## 2013-11-15 LAB — COMPREHENSIVE METABOLIC PANEL
ALK PHOS: 66 U/L
ALT: 19 U/L (ref 12–78)
AST: 37 U/L (ref 15–37)
Albumin: 4 g/dL (ref 3.4–5.0)
Anion Gap: 9 (ref 7–16)
BUN: 33 mg/dL — AB (ref 7–18)
Bilirubin,Total: 0.5 mg/dL (ref 0.2–1.0)
CALCIUM: 9.8 mg/dL (ref 8.5–10.1)
Chloride: 97 mmol/L — ABNORMAL LOW (ref 98–107)
Co2: 23 mmol/L (ref 21–32)
Creatinine: 1.02 mg/dL (ref 0.60–1.30)
EGFR (Non-African Amer.): 50 — ABNORMAL LOW
GFR CALC AF AMER: 58 — AB
GLUCOSE: 117 mg/dL — AB (ref 65–99)
Osmolality: 267 (ref 275–301)
Potassium: 4.6 mmol/L (ref 3.5–5.1)
SODIUM: 129 mmol/L — AB (ref 136–145)
Total Protein: 8.1 g/dL (ref 6.4–8.2)

## 2013-11-15 LAB — TROPONIN I

## 2013-11-16 LAB — BASIC METABOLIC PANEL
Anion Gap: 8 (ref 7–16)
BUN: 17 mg/dL (ref 7–18)
Calcium, Total: 8.7 mg/dL (ref 8.5–10.1)
Chloride: 99 mmol/L (ref 98–107)
Co2: 24 mmol/L (ref 21–32)
Creatinine: 0.96 mg/dL (ref 0.60–1.30)
EGFR (African American): >60
EGFR (Non-African Amer.): 54 — ABNORMAL LOW
GLUCOSE: 82 mg/dL (ref 65–99)
OSMOLALITY: 263 (ref 275–301)
Potassium: 4.3 mmol/L (ref 3.5–5.1)
Sodium: 131 mmol/L — ABNORMAL LOW (ref 136–145)

## 2013-11-16 LAB — CBC WITH DIFFERENTIAL/PLATELET
Basophil #: 0 10*3/uL (ref 0.0–0.1)
Basophil %: 0.8 %
Eosinophil #: 0.1 10*3/uL (ref 0.0–0.7)
Eosinophil %: 1.6 %
HCT: 35.3 % (ref 35.0–47.0)
HGB: 11.7 g/dL — AB (ref 12.0–16.0)
Lymphocyte #: 1.3 10*3/uL (ref 1.0–3.6)
Lymphocyte %: 23 %
MCH: 31.1 pg (ref 26.0–34.0)
MCHC: 33.3 g/dL (ref 32.0–36.0)
MCV: 93 fL (ref 80–100)
MONO ABS: 0.9 x10 3/mm (ref 0.2–0.9)
Monocyte %: 16.4 %
NEUTROS ABS: 3.2 10*3/uL (ref 1.4–6.5)
Neutrophil %: 58.2 %
Platelet: 231 10*3/uL (ref 150–440)
RBC: 3.78 10*6/uL — AB (ref 3.80–5.20)
RDW: 12.6 % (ref 11.5–14.5)
WBC: 5.4 10*3/uL (ref 3.6–11.0)

## 2013-11-16 LAB — MAGNESIUM: MAGNESIUM: 1.5 mg/dL — AB

## 2013-11-16 LAB — URINE CULTURE

## 2013-11-16 LAB — TSH: Thyroid Stimulating Horm: 1.63 u[IU]/mL

## 2013-11-21 ENCOUNTER — Encounter: Payer: Self-pay | Admitting: Internal Medicine

## 2013-11-21 ENCOUNTER — Ambulatory Visit (INDEPENDENT_AMBULATORY_CARE_PROVIDER_SITE_OTHER): Payer: Medicare Other | Admitting: Internal Medicine

## 2013-11-21 VITALS — BP 126/60 | HR 76 | Temp 98.2°F | Ht 64.0 in | Wt 144.2 lb

## 2013-11-21 DIAGNOSIS — M545 Low back pain, unspecified: Secondary | ICD-10-CM

## 2013-11-21 DIAGNOSIS — Q613 Polycystic kidney, unspecified: Secondary | ICD-10-CM

## 2013-11-21 DIAGNOSIS — M25552 Pain in left hip: Secondary | ICD-10-CM

## 2013-11-21 DIAGNOSIS — I10 Essential (primary) hypertension: Secondary | ICD-10-CM

## 2013-11-21 DIAGNOSIS — M4800 Spinal stenosis, site unspecified: Secondary | ICD-10-CM

## 2013-11-21 DIAGNOSIS — M25559 Pain in unspecified hip: Secondary | ICD-10-CM

## 2013-11-21 DIAGNOSIS — E871 Hypo-osmolality and hyponatremia: Secondary | ICD-10-CM

## 2013-11-21 NOTE — Progress Notes (Signed)
Pre visit review using our clinic review tool, if applicable. No additional management support is needed unless otherwise documented below in the visit note. 

## 2013-11-22 ENCOUNTER — Telehealth: Payer: Self-pay | Admitting: Internal Medicine

## 2013-11-22 NOTE — Telephone Encounter (Signed)
Filled out and given to Dr. Nicki Reaper for signature

## 2013-11-22 NOTE — Telephone Encounter (Signed)
Pt dropped off a FL2 form that needs to be redone. Please call niece when ready to be picked up.

## 2013-11-22 NOTE — Telephone Encounter (Signed)
Butch Penny, pt's niece, notified FL-2 ready for pickup

## 2013-11-25 ENCOUNTER — Encounter: Payer: Self-pay | Admitting: Internal Medicine

## 2013-11-25 DIAGNOSIS — M25552 Pain in left hip: Secondary | ICD-10-CM | POA: Insufficient documentation

## 2013-11-25 NOTE — Progress Notes (Signed)
Subjective:    Patient ID: Sierra Gardner, female    DOB: 04/04/1925, 80 y.o.   MRN: [id number]  HPI 80 year old female with past history of polycystic kidney disease, hypertension, GERD and degenerative joint disease who comes in today as a work in for a hospital f/u and to have an FL2 completed.   Breathing is stable.  No increased sob.  Still seeing nephrology.  Renal function stable.   Seeing Dr Saralyn Pilar.  Blood pressure has been doing well on outside checks. She has seen Dr Rudene Christians previously.   Had xray reviewed.  Has nerve damage.  Has stenosis.  Right leg shorter.  States that one week ago, she went to the bathroom and could not get up off the toilet.  Called EMS and was taken to ER.  Was admitted with increased pain and documented weakness and dehydration.  Instructed needed 24 hour care.  She has sitters now.  Planning to go into assisted living in two days.  Needs FL2 completed.  Describes increased pain in her left knee and left lateral hip.  Limited mobility related to increased pain.  Planning to start phsical therapy.       Past Medical History  Diagnosis Date  . Asthma   . Polycystic kidney disease   . Hypertension   . GERD (gastroesophageal reflux disease)   . Hyponatremia   . Fibrocystic breast disease   . Degenerative joint disease   . History of cholelithiasis   . Anal fissure   . Sinus problem     Current Outpatient Prescriptions on File Prior to Visit  Medication Sig Dispense Refill  . Biotin 1000 MCG tablet Take 1,000 mcg by mouth daily.      . Calcium Carbonate-Vitamin D (CALCIUM 600 + D PO) Take 600 mg by mouth 2 (two) times daily.       . cholecalciferol (VITAMIN D) 1000 UNITS tablet Take 2,000 Units by mouth daily.      Marland Kitchen CRANBERRY PO Take by mouth.      . lansoprazole (PREVACID) 30 MG capsule Take 30 mg by mouth daily.      Marland Kitchen lisinopril (PRINIVIL,ZESTRIL) 40 MG tablet Take 40 mg by mouth 2 (two) times daily.       . Magnesium 250 MG TABS Take 250 mg by mouth  daily.       Marland Kitchen spironolactone (ALDACTONE) 25 MG tablet 25 mg.       . verapamil (CALAN) 120 MG tablet Take 120 mg by mouth.       . vitamin C (ASCORBIC ACID) 500 MG tablet Take 500 mg by mouth daily.       No current facility-administered medications on file prior to visit.    Review of Systems Patient denies any headache, lightheadedness or dizziness.  No sinus or allergy symptoms.   No chest pain, tightness or palpitations.  No increased shortness of breath, cough or congestion.  Breathing stable.  No nausea or vomiting.  No abdominal pain or cramping.  No acid reflux.  No bowel change, such as diarrhea, constipation, BRBPR or melana.  No urine change.  Increased left hip and knee pain as outlined.  Limited mobility secondary to pain.       Objective:   Physical Exam  Filed Vitals:   11/21/13 1440  BP: 126/60  Pulse: 76  Temp: 98.2 F (63.54 C)   80 year old female in no acute distress.   NECK:  Supple.  Nontender.  No  audible bruit.  HEART:  Appears to be regular. LUNGS:  No crackles or wheezing audible.  Respirations even and unlabored.  RADIAL PULSE:  Equal bilaterally.   ABDOMEN:  Soft, nontender.  Bowel sounds present and normal.  No audible abdominal bruit.      MSK:  Increased pain with weight bearng.  Needs assistance going from seated to standing position.  Needs assistance walking and turning.  Hesitancy - walking.  Motor strength hard to fully assess secondary to increased pain.  Pain localized to the left hip nad left knee.           Assessment & Plan:  GU.  Taking a cranberry tablet daily now and she feels this is helping.  Follow.    GI.  Colonoscopy 11/22/08 normal.  Has a history of an anal fissure.  Currently doing well.    PREVIOUS BREAST CHANGE.  Saw Dr Bary Castilla.  S/p benign biopsy.  Recently evaluated for abnormal mammo.  See Dr Dwyane Luo note for details.  Has been released.  Biopsy negative.  He recommended f/u mammogram in one year s/p last.     CARDIOVASCULAR.  ECHO  09/08/11 revealed moderate aortic insufficiency with EF 50-55%.  Follow.  Currently asymptomatic.    HEALTH MAINTENANCE.  Physical last visit with me.  She is s/p hysterectomy.  Colonoscopy as outlined.  Will notify me when agreeable for bone density.  Mammogram as outlined above.      I spent 40 minutes with the patient and more than 50% of the time was spent in consultation regarding the above (specifically her recent hospitalization, increased pain and further w/up and plans to move to assisted living).

## 2013-11-25 NOTE — Assessment & Plan Note (Signed)
Is followed by nephrology.   Stable.  Avoid antiinflammatories.

## 2013-11-25 NOTE — Assessment & Plan Note (Signed)
Saw Dr Rudene Christians.  Support in her shoe initially helped.  Increased pain now as outlined.  Refer to ortho.

## 2013-11-25 NOTE — Assessment & Plan Note (Signed)
Sodium has been relatively stable.  Followed by nephrology.  Will follow.

## 2013-11-25 NOTE — Assessment & Plan Note (Signed)
Followed by nephrology.  Blood pressures at home under good control.  Continue same meds.  Follow metabolic panel.

## 2013-11-25 NOTE — Assessment & Plan Note (Signed)
Left hip and knee pain as outlined.  Increased more recently.  Recently hospitalized.  Has 24 hour care now.  Limited mobility.   Needs assistance with getting dressed, cooking and bathing.  Needs assistance with ambulation.  Planning to transition to assisted living this week.  The pain appears to be limiting her.  Walks with a walker and assistance.  Planning to start physical therapy.  xrays in the hospital.  Does have a history of spinal stenosis.  Has seen Dr Rudene Christians previously for left hip and leg pain.  Continue tylenol.  Unable to take antiinflammatories.  Would avoid narcotics secondary to her unsteadiness.  Will refer back to Dr Rudene Christians for further evaluation and treatment.

## 2013-11-26 ENCOUNTER — Telehealth: Payer: Self-pay | Admitting: *Deleted

## 2013-11-26 NOTE — Telephone Encounter (Signed)
Left message to inform niece Butch Penny) of appt at [facility] on Wednesday 7/8 @ 2:15. Tried to reach Ms. Pfeifer on mobile number-no voicemail

## 2013-11-26 NOTE — Telephone Encounter (Signed)
Sierra Legendre is now at [facility].  Niece will need to know so that she can get transportation for Sierra Gardner.  Thanks.

## 2013-11-27 NOTE — Telephone Encounter (Signed)
[facility] @ [phone] & notified them of her appt with [facility] @ 2:15.

## 2013-11-28 DIAGNOSIS — Z0279 Encounter for issue of other medical certificate: Secondary | ICD-10-CM

## 2013-11-28 NOTE — Telephone Encounter (Signed)
Spoke with Butch Penny (neice) today & she is aware of the appt today with [facility]. I also informed her that we faxed over the corrected FL2 form to [facility]

## 2013-12-05 ENCOUNTER — Encounter: Payer: Self-pay | Admitting: *Deleted

## 2013-12-05 ENCOUNTER — Telehealth: Payer: Self-pay | Admitting: *Deleted

## 2013-12-05 NOTE — Telephone Encounter (Signed)
Can try robitussin DM bid to help with cough.  Gargle with salt water bid.  Saline nasal spray - flush nose 3x/day.  If persistent symptoms, will need to be evaluated.

## 2013-12-05 NOTE — Telephone Encounter (Signed)
Stacy notified verbally & letter faxed at her request (fax# [phone])

## 2013-12-05 NOTE — Telephone Encounter (Signed)
Stacy with [facility] left voicemail stating that patient C/O raw throat from sinus drainage & coughing all night. Needs advice on what to give her. She has nothing on hand. Please advise

## 2013-12-06 ENCOUNTER — Telehealth: Payer: Self-pay | Admitting: *Deleted

## 2013-12-06 NOTE — Telephone Encounter (Signed)
Spoke with Denny Peon advised of MDs orders.

## 2013-12-06 NOTE — Telephone Encounter (Signed)
Spoke with Denny Peon advised of MDs message.

## 2013-12-06 NOTE — Telephone Encounter (Signed)
Notify them orders are for 10 days.  Let us know if persistent problems.

## 2013-12-06 NOTE — Telephone Encounter (Signed)
Leah called requesting dispense quantity on Robitussin.  Please advise

## 2013-12-06 NOTE — Telephone Encounter (Signed)
Leah from [facility] called states they need duration, quantity to be given of the Robitussin and the Saline Nasal Spray.  Please advise

## 2013-12-06 NOTE — Telephone Encounter (Signed)
10 ccs bid x 10 days

## 2013-12-09 DIAGNOSIS — M159 Polyosteoarthritis, unspecified: Secondary | ICD-10-CM

## 2013-12-09 DIAGNOSIS — M7120 Synovial cyst of popliteal space [Baker], unspecified knee: Secondary | ICD-10-CM

## 2013-12-09 DIAGNOSIS — IMO0001 Reserved for inherently not codable concepts without codable children: Secondary | ICD-10-CM

## 2013-12-09 DIAGNOSIS — M48061 Spinal stenosis, lumbar region without neurogenic claudication: Secondary | ICD-10-CM

## 2013-12-13 ENCOUNTER — Telehealth: Payer: Self-pay | Admitting: Internal Medicine

## 2013-12-13 NOTE — Telephone Encounter (Signed)
Pts niece came in and dropped off papers to be filled out by Dr. Nicki Reaper. Placed papers in box. Asked if we could have them filled out before next Thursday.

## 2013-12-14 NOTE — Telephone Encounter (Signed)
FL-2 placed in your folder

## 2013-12-15 NOTE — Telephone Encounter (Signed)
Will complete 

## 2013-12-18 DIAGNOSIS — Z0279 Encounter for issue of other medical certificate: Secondary | ICD-10-CM

## 2013-12-20 NOTE — Telephone Encounter (Signed)
Spoke with Butch Penny & notified her that FL2 is complete. Form placed up front.

## 2014-01-03 ENCOUNTER — Encounter: Payer: Self-pay | Admitting: Internal Medicine

## 2014-01-03 ENCOUNTER — Ambulatory Visit (INDEPENDENT_AMBULATORY_CARE_PROVIDER_SITE_OTHER): Payer: Medicare Other | Admitting: Internal Medicine

## 2014-01-03 VITALS — BP 130/70 | HR 75 | Temp 98.1°F | Ht 64.0 in | Wt 144.2 lb

## 2014-01-03 DIAGNOSIS — M25552 Pain in left hip: Secondary | ICD-10-CM

## 2014-01-03 DIAGNOSIS — I359 Nonrheumatic aortic valve disorder, unspecified: Secondary | ICD-10-CM

## 2014-01-03 DIAGNOSIS — M25559 Pain in unspecified hip: Secondary | ICD-10-CM

## 2014-01-03 DIAGNOSIS — D649 Anemia, unspecified: Secondary | ICD-10-CM

## 2014-01-03 DIAGNOSIS — R920 Mammographic microcalcification found on diagnostic imaging of breast: Secondary | ICD-10-CM

## 2014-01-03 DIAGNOSIS — I10 Essential (primary) hypertension: Secondary | ICD-10-CM

## 2014-01-03 DIAGNOSIS — Q613 Polycystic kidney, unspecified: Secondary | ICD-10-CM

## 2014-01-03 DIAGNOSIS — M4800 Spinal stenosis, site unspecified: Secondary | ICD-10-CM

## 2014-01-03 DIAGNOSIS — E871 Hypo-osmolality and hyponatremia: Secondary | ICD-10-CM

## 2014-01-03 DIAGNOSIS — I351 Nonrheumatic aortic (valve) insufficiency: Secondary | ICD-10-CM

## 2014-01-03 LAB — COMPREHENSIVE METABOLIC PANEL
ALK PHOS: 59 U/L (ref 39–117)
ALT: 13 U/L (ref 0–35)
AST: 24 U/L (ref 0–37)
Albumin: 3.7 g/dL (ref 3.5–5.2)
BILIRUBIN TOTAL: 0.5 mg/dL (ref 0.2–1.2)
BUN: 19 mg/dL (ref 6–23)
CO2: 21 mEq/L (ref 19–32)
Calcium: 9.3 mg/dL (ref 8.4–10.5)
Chloride: 93 mEq/L — ABNORMAL LOW (ref 96–112)
Creatinine, Ser: 0.9 mg/dL (ref 0.4–1.2)
GFR: 65.47 mL/min (ref >60.00)
Glucose, Bld: 82 mg/dL (ref 70–99)
Potassium: 4.8 mEq/L (ref 3.5–5.1)
SODIUM: 125 meq/L — AB (ref 135–145)
Total Protein: 6.9 g/dL (ref 6.0–8.3)

## 2014-01-03 LAB — CBC WITH DIFFERENTIAL/PLATELET
Basophils Absolute: 0 10*3/uL (ref 0.0–0.1)
Basophils Relative: 0.5 % (ref 0.0–3.0)
Eosinophils Absolute: 0 10*3/uL (ref 0.0–0.7)
Eosinophils Relative: 0.7 % (ref 0.0–5.0)
HCT: 36.6 % (ref 36.0–46.0)
Hemoglobin: 12.3 g/dL (ref 12.0–15.0)
LYMPHS ABS: 1 10*3/uL (ref 0.7–4.0)
Lymphocytes Relative: 17 % (ref 12.0–46.0)
MCHC: 33.6 g/dL (ref 30.0–36.0)
MCV: 92.9 fl (ref 78.0–100.0)
MONOS PCT: 13.8 % — AB (ref 3.0–12.0)
Monocytes Absolute: 0.8 10*3/uL (ref 0.1–1.0)
NEUTROS PCT: 68 % (ref 43.0–77.0)
Neutro Abs: 4.1 10*3/uL (ref 1.4–7.7)
PLATELETS: 282 10*3/uL (ref 150.0–400.0)
RBC: 3.94 Mil/uL (ref 3.87–5.11)
RDW: 12.9 % (ref 11.5–15.5)
WBC: 6.1 10*3/uL (ref 4.0–10.5)

## 2014-01-03 LAB — TSH: TSH: 1.4 u[IU]/mL (ref 0.35–4.50)

## 2014-01-03 LAB — FERRITIN: Ferritin: 107.2 ng/mL (ref 10.0–291.0)

## 2014-01-04 ENCOUNTER — Other Ambulatory Visit: Payer: Self-pay | Admitting: Internal Medicine

## 2014-01-04 DIAGNOSIS — E871 Hypo-osmolality and hyponatremia: Secondary | ICD-10-CM

## 2014-01-04 NOTE — Progress Notes (Signed)
Order placed for f/u sodium.  ?

## 2014-01-06 ENCOUNTER — Encounter: Payer: Self-pay | Admitting: Internal Medicine

## 2014-01-06 ENCOUNTER — Telehealth: Payer: Self-pay | Admitting: Internal Medicine

## 2014-01-06 NOTE — Assessment & Plan Note (Signed)
Sodium has been relatively stable.  Followed by nephrology.  Will follow.  Recheck today.

## 2014-01-06 NOTE — Telephone Encounter (Signed)
Pt is due a f/u mammogram.  If she is agreeable to proceed, let me know and I will order.  Thanks.

## 2014-01-06 NOTE — Assessment & Plan Note (Signed)
Saw Dr. Bary Castilla.  See his note for details.  Has been released.  Biopsy negative.  He recommended a f/u mammogram in one year after last.  Due end of August.  Will need to schedule.

## 2014-01-06 NOTE — Assessment & Plan Note (Signed)
Saw Dr Rudene Christians.  Needs hip replacement.  She is not interested in pursuing.  Had physical therapy.  Continue exercise.   Walker to ambulate.

## 2014-01-06 NOTE — Progress Notes (Signed)
Subjective:    Patient ID: Sierra Gardner, female    DOB: 01-13-1925, 80 y.o.   MRN: [id number]  HPI 80 year old female with past history of polycystic kidney disease, hypertension, GERD and degenerative joint disease who comes in today for a scheduled follow up.   Breathing is stable.  No increased sob.  Still seeing nephrology.  Renal function stable.   Seeing Dr Saralyn Pilar.  Blood pressure has been doing well on outside checks. She has seen Dr Rudene Christians.  Apparently needs hip replacement.  States he advised against.  She desires not to have surgery.   Has nerve damage.  Has stenosis.  Right leg shorter.   Increased pain with ambulation.  Does ambulate with walker.  Had physical therapy.  States went well.  Limited physical activity.  Resides at [facility] now.  Appears to have adapted well.  Overall she feels things are stable.  Due f/u with Dr Saralyn Pilar 9/15.       Past Medical History  Diagnosis Date  . Asthma   . Polycystic kidney disease   . Hypertension   . GERD (gastroesophageal reflux disease)   . Hyponatremia   . Fibrocystic breast disease   . Degenerative joint disease   . History of cholelithiasis   . Anal fissure   . Sinus problem     Current Outpatient Prescriptions on File Prior to Visit  Medication Sig Dispense Refill  . Biotin 1000 MCG tablet Take 1,000 mcg by mouth daily.      . Calcium Carbonate-Vitamin D (CALCIUM 600 + D PO) Take 600 mg by mouth 2 (two) times daily.       . cholecalciferol (VITAMIN D) 1000 UNITS tablet Take 2,000 Units by mouth daily.      Marland Kitchen CRANBERRY PO Take by mouth.      . lansoprazole (PREVACID) 30 MG capsule Take 30 mg by mouth daily.      Marland Kitchen lisinopril (PRINIVIL,ZESTRIL) 40 MG tablet Take 40 mg by mouth 2 (two) times daily.       . Magnesium 250 MG TABS Take 250 mg by mouth daily.       Marland Kitchen spironolactone (ALDACTONE) 25 MG tablet 25 mg.       . verapamil (CALAN) 120 MG tablet Take 120 mg by mouth.       . vitamin C (ASCORBIC ACID) 500 MG tablet Take  500 mg by mouth daily.       No current facility-administered medications on file prior to visit.    Review of Systems Patient denies any headache, lightheadedness or dizziness.  No sinus or allergy symptoms.   No chest pain, tightness or palpitations.  No increased shortness of breath, cough or congestion.  Breathing stable.  No nausea or vomiting.  No abdominal pain or cramping.  No acid reflux.  No bowel change, such as diarrhea, constipation, BRBPR or melana.  No urine change.  Increased left hip and knee pain as outlined.  Limited mobility secondary to pain.  Apparently needs hip replacement.  Desires no surgery.       Objective:   Physical Exam  Filed Vitals:   01/03/14 1136  BP: 130/70  Pulse: 75  Temp: 98.1 F (36.7 C)   Blood pressure recheck:  21/60  80 year old female in no acute distress.   NECK:  Supple.  Nontender.  No audible bruit.  HEART:  Appears to be regular. LUNGS:  No crackles or wheezing audible.  Respirations even and unlabored.  RADIAL PULSE:  Equal bilaterally.   ABDOMEN:  Soft, nontender.  Bowel sounds present and normal.  No audible abdominal bruit.      MSK:  Increased pain with weight bearng.  Needs assistance going from seated to standing position.  Needs assistance walking and turning.  Hesitancy - walking.       Assessment & Plan:  GU.  Taking a cranberry tablet daily now and she feels this is helping.  Follow.    GI.  Colonoscopy 11/22/08 normal.  Has a history of an anal fissure.  Currently doing well.    PREVIOUS BREAST CHANGE.  Saw Dr Bary Castilla.  S/p benign biopsy.  Recently evaluated for abnormal mammo.  See Dr Dwyane Luo note for details.  Has been released.  Biopsy negative.  He recommended f/u mammogram in one year s/p last.  Due end of 8/15.    CARDIOVASCULAR.  ECHO  09/08/11 revealed moderate aortic insufficiency with EF 50-55%.  Follow.  Currently asymptomatic.  Due to f/u with Dr Saralyn Pilar 9/15.    HEALTH MAINTENANCE.  Physical 09/03/13.    She is s/p hysterectomy.  Colonoscopy as outlined.  Will notify me when agreeable for bone density.  Mammogram as outlined above.      I spent 25 minutes with the patient and more than 50% of the time was spent in consultation regarding the above (specifically her increased pain and further w/up and plans as well as follow up on her chronic issues).

## 2014-01-06 NOTE — Assessment & Plan Note (Signed)
Is followed by nephrology.   Stable.  Avoid antiinflammatories.  Check renal function today.

## 2014-01-06 NOTE — Assessment & Plan Note (Signed)
Followed by nephrology.  Blood pressures at home under good control.  Continue same meds.  Follow metabolic panel.

## 2014-01-06 NOTE — Assessment & Plan Note (Signed)
Saw Dr Rudene Christians.  Support in her shoe initially helped.  Increased pain now.  Needs surgery on her hip.  Follow.  She desires no further intervention.

## 2014-01-06 NOTE — Assessment & Plan Note (Signed)
ECHO 09/08/11 revealed moderate aortic insufficiency.  Will follow.  Currently asymptomatic.  Sees Dr Saralyn Pilar.  Scheduled to follow up with him next month.

## 2014-01-07 ENCOUNTER — Telehealth: Payer: Self-pay | Admitting: Internal Medicine

## 2014-01-07 ENCOUNTER — Encounter: Payer: Self-pay | Admitting: *Deleted

## 2014-01-07 NOTE — Telephone Encounter (Signed)
Pt's niece called in was asking about inquiring about pts lab appt on Wednesday and stated since she has mobility issues she would like to have orders faxed over to [facility] if possible for them to be able to do the labs there. Niece would like a call back @ [phone].

## 2014-01-07 NOTE — Telephone Encounter (Signed)
Pt's niece called in was asking about inquiring about pts lab appt on Wednesday and stated since she has mobility issues she would like to have orders faxed over to [facility] if possible for them to be able to do the labs there. Niece would like a call back @ [phone].

## 2014-01-08 ENCOUNTER — Other Ambulatory Visit: Payer: Medicare Other

## 2014-01-08 NOTE — Telephone Encounter (Signed)
Rx faxed to [facility] & niece notified via voicemail

## 2014-01-08 NOTE — Telephone Encounter (Signed)
rx for labs placed in your box.  Please notify [facility] need drawing.   Thanks

## 2014-01-08 NOTE — Telephone Encounter (Signed)
See below

## 2014-01-09 ENCOUNTER — Emergency Department: Payer: Self-pay | Admitting: Internal Medicine

## 2014-01-09 ENCOUNTER — Other Ambulatory Visit: Payer: Medicare Other

## 2014-01-09 ENCOUNTER — Telehealth: Payer: Self-pay | Admitting: Internal Medicine

## 2014-01-09 NOTE — Telephone Encounter (Signed)
Screening mammogram    10.1.15 @ 1:00  I called the patient to inform her of her appointment she does not have a answering machine or voicemail/8.19.15 Will attempt to call again.

## 2014-01-10 NOTE — Telephone Encounter (Signed)
Appointment scheduled by Lorriane Shire, has attempted to contact pt with appoint.

## 2014-01-11 NOTE — Telephone Encounter (Signed)
Patient notified of her appointment at [facility].1.15 @ 1:00

## 2014-01-19 ENCOUNTER — Telehealth: Payer: Self-pay | Admitting: Internal Medicine

## 2014-01-19 NOTE — Telephone Encounter (Signed)
Received sodium lab from [facility].  Sodium is slightly improved from the last check here, but still low (127).  Continue fluid restriction.  Pt knows how much to limit fluid intake.  She has a history of low sodium.  Recheck sodium in 10 days.  Also, need to confirm with pt that she has a f/u appt with her nephrologist (soon).  Will need to f/u with him regarding her sodium.   Sodium to be drawn at [facility].

## 2014-01-21 ENCOUNTER — Encounter: Payer: Self-pay | Admitting: *Deleted

## 2014-01-21 NOTE — Telephone Encounter (Signed)
Letter faxed to [facility]

## 2014-02-05 NOTE — Telephone Encounter (Signed)
Faxed note to [facility] to cancel appt for patient. Dr. Nicki Reaper was notified around 1am that patient passed away.

## 2014-02-21 DEATH — deceased

## 2014-03-19 ENCOUNTER — Encounter: Payer: Self-pay | Admitting: Internal Medicine

## 2014-03-25 ENCOUNTER — Encounter: Payer: Self-pay | Admitting: Internal Medicine

## 2014-05-06 ENCOUNTER — Ambulatory Visit: Payer: Medicare Other | Admitting: Internal Medicine

## 2014-08-11 IMAGING — CR DG OUTSIDE FILMS BODY
1 series · 1 of 1 positions shown · non-contrast
Comparison: none

[CC]
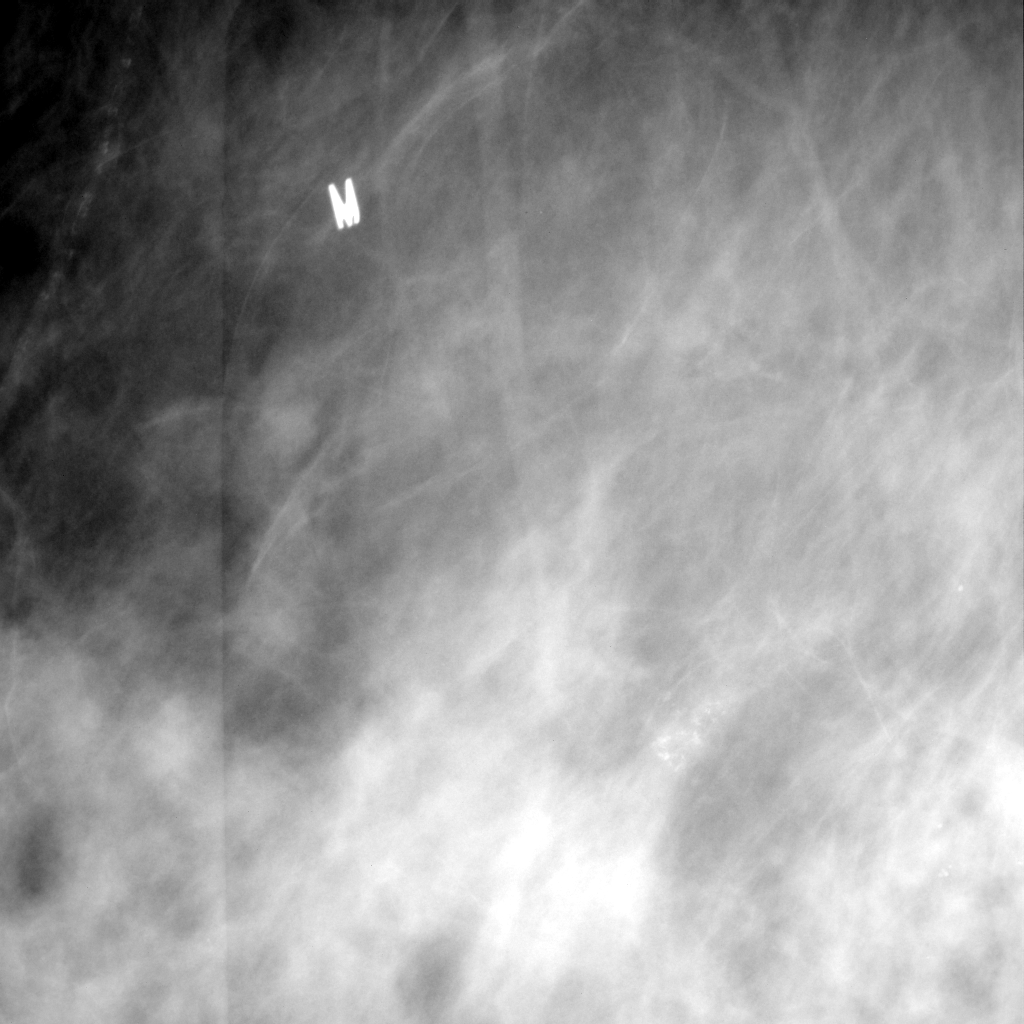

[1 of 1 positions shown; findings below may reference images not displayed]

Canned report from images found in remote index.

Refer to host system for actual result text.

## 2014-09-13 NOTE — Op Note (Signed)
PATIENT NAME:  Sierra Gardner, Sierra Gardner MR#:  [id number] DATE OF BIRTH:  [birth year]  DATE OF PROCEDURE:  03/19/2013  PREOPERATIVE DIAGNOSIS: Abnormal left breast mammogram, flat atypical hyperplasia.   OPERATIVE PROCEDURE: Wide excision, left breast with ultrasound guidance.   SURGEON: Dr.  Hervey Ard.   ANESTHESIA: General by LMA under Dr. [place] Service, Marcaine 0.5% with 1: 200,000 units epinephrine, 30 mL local infiltration.   ESTIMATED BLOOD LOSS: Minimal.    CLINICAL NOTE: This 81 year old woman had an abnormal mammogram and core biopsy showed atypical flat hyperplasia. The pathologist reported a 30% chance of upstaging on wide excision. The patient was apprised of her options and she presents today for wide excision.   OPERATIVE NOTE: With the patient under adequate general anesthesia, the breast was prepped with ChloraPrep and draped. Ultrasound was used to identify the previously completed biopsy cavity in the one o'clock position, 6 to 8 cm from the nipple. Marcaine was infiltrated for postoperative analgesia. A curvilinear incision in the upper outer quadrant of the breast was carried down through the skin and subcutaneous tissue with hemostasis achieved by electrocautery. The block of tissue measuring 4 x 4 x 4 cm  in diameter was excised down to and including the pectoralis fascia. The specimen was orientated and specimen radiograph confirmed the previously placed cylinder clip. The breast was elevated off the underlying muscle and approximated at the prefascial layer with interrupted 2-0 Vicryl figure-of-eight sutures. The deep adipose tissue was approximated in a similar fashion. The skin was closed with a running 4-0 Vicryl subcuticular suture. Benzoin, Steri-Strips, Telfa,followed by fluffed gauze, Kerlix, and Ace wrap.   The patient tolerated the procedure well and was taken to the recovery room in stable condition.   ____________________________ Robert Bellow,  MD jwb:sg D: 03/19/2013 10:02:45 ET T: 03/19/2013 11:04:03 ET JOB#: [id number]  cc: Robert Bellow, MD, <Dictator> Einar Pheasant, MD  Glennis Borger Amedeo Kinsman MD ELECTRONICALLY SIGNED 03/19/2013 18:49

## 2014-09-14 NOTE — Consult Note (Signed)
PATIENT NAME:  Sierra Gardner, Sierra Gardner MR#:  [id number] DATE OF BIRTH:  11-20-1924  DATE OF CONSULTATION:  11/16/2013  REFERRING PHYSICIAN:   CONSULTING PHYSICIAN:  Timoteo Gaul, MD  REASON FOR CONSULTATION: Left knee pain with lower extremity weakness.   HISTORY:  Patient is an 81 year old female who presents with left lower extremity pain and edema. She had recently been diagnosed, approximately 3 weeks ago, by her primary care physician with a Baker's cyst behind the left knee by ultrasound. The patient has had two previous spinal surgeries by Dr. Karie Chimera. She states that she has had a left foot drop since her second spinal surgery.   The day prior to admission, the patient went to use the restroom at night and was unable to stand due to weakness. She states that it was not due to pain, but she physically could not stand up from toilet seat. She spent most of the night on the toilet before calling  EMS to bring her to the hospital. She was admitted to the medical service for further evaluation and management. Orthopedics was consulted to evaluate the patient for her Baker's cyst, and whether this is a contributing factor to her lower extremity weakness.   PAST MEDICAL HISTORY: Includes hypertension, hyponatremia, polycystic kidney disease, polycystic liver disease, chronic low back pain, left knee Baker cyst, and osteoarthritis.   SOCIAL HISTORY: The patient has never smoked or used alcohol. She is widowed and lives alone. She has no children.   PAST SURGICAL HISTORY: Includes hemorrhoidectomy, breast biopsy, cataract surgery in both eyes, total abdominal hysterectomy, and 2 prior back surgeries.   MEDICATIONS: Include vitamin D, verapamil, tramadol, Prevacid, magnesium, lisinopril, cranberry, Centrum Silver, calcium plus vitamin D, biotin, and vitamin C.   PHYSICAL EXAMINATION: The patient was sitting in her hospital room. She is in no acute distress. She is currently not having any  significant knee pain. She has no knee effusion. There is no erythema or ecchymosis. She has slight palpable fullness in the popliteal fossa consistent with a small Baker's cyst. She was able to actively extend her knee fully. She can perform a straight leg raise without lag. She has intact sensation to light touch and palpable pedal pulses. The patient does have a footdrop and is unable to actively dorsiflex her foot, but can provide downward pressure with plantar flexion. She had mild tenderness along the anterior, lateral, and posterior thigh. She had no pain with internal or external rotation of the left hip.   RADIOLOGY: X-ray films of the lumbar spine which included AP and lateral show significant degenerative changes. There is severe joint space narrowing between L5 and S1, and moderate joint space narrowing between L4 and L5 with corresponding facet joint arthrosis. The AP and lateral of the left knee did not show significant joint space narrowing, nor did she have any evidence of fracture, dislocation, or other osseous abnormality.   ASSESSMENT: Lumbar spine degenerative disk disease, facet arthritis, and possible disk herniation.   PLAN: I do not believe Ms. Garzon lower extremity weakness and symptoms are due to her Baker's cyst. I believe they are more related to her lumbar spine. This has advanced arthritis and there may be nerve root compression either from the degenerative changes themselves or possibly an associated herniated disk. She was able to walk around the nurse's station today and her condition seems to be improving. She would be a candidate for an anti-inflammatory or possibly a Medrol dose pack. She should continue with  physical therapy and should follow up with Dr. Hal Neer is an outpatient for further evaluation of her spine given her previous surgery with him.   I discussed this case with Dr. Bridgette Habermann.    ____________________________ Timoteo Gaul,  MD klk:ts D: 11/16/2013 13:33:19 ET T: 11/16/2013 14:19:33 ET JOB#: [id number]  cc: Timoteo Gaul, MD, <Dictator> Timoteo Gaul MD ELECTRONICALLY SIGNED 11/29/2013 12:35

## 2014-09-14 NOTE — Discharge Summary (Signed)
PATIENT NAME:  Sierra Gardner, Sierra Gardner MR#:  [id number] DATE OF BIRTH:  1924-08-07  DATE OF ADMISSION:  11/15/2013 DATE OF DISCHARGE:  11/17/2013  PRIMARY CARE PHYSICIAN: Dr. Einar Pheasant.   FINAL DIAGNOSES: 1.  Weakness, initially dehydration.  2.  Hypomagnesemia.  3.  Hyponatremia.  4.  Hypertension.  5.  Polycystic kidney disease.  6.  Baker's cyst.    MEDICATIONS ON DISCHARGE: Include spironolactone 25 mg daily, calcium and vitamin D 1 tablet daily, verapamil SR 120 mg daily, vitamin D 1000 international units daily, Centrum Silver multivitamin 1 tablet daily, Prevacid OTC 15 mg daily, biotin 1000 mcg daily, cranberry oral capsule 1 capsule daily, tramadol 50 mg every four hours as needed for pain, vitamin C 500 mg once a day, lisinopril 40 mg twice a day, magnesium oxide 250 mg daily.   HOME HEALTH: Yes. Physical therapy.   DIET: Regular diet, regular consistency.   FOLLOWUP: In 1 to 2 weeks with Dr. Einar Pheasant. The patient will also have a 24/7 aide.   HOSPITAL COURSE: The patient was admitted 11/15/2013 and discharged 11/17/2013. She came in with weakness, with deconditioning. She was sitting on the commode and could not get off the commode. She was admitted with mild dehydration, was given IV fluid hydration. Also had hyponatremia.   LABORATORY AND RADIOLOGICAL DATA DURING THE HOSPITAL COURSE: EKG showed sinus rhythm, premature ventricular complexes. Vitamin D was elevated at 75. B12 lower range of 233. Troponin negative. Glucose 117, BUN 33, creatinine 1.02, sodium 129, potassium 4.6, chloride 97, CO2 23, calcium 9.8. Liver function tests normal range. White blood cell count 9.6, hemoglobin and hematocrit 12.4 and 38.2, platelet count of 263. Urine culture: Mixed contamination. TSH 1.63, magnesium 1.5. Sodium upon discharge 131. Knee x-ray: Negative. Lumbar spine x-ray: Degenerative changes. The patient was seen in consultation by Dr. Mack Guise of orthopedics, who did not believe that  the Baker's cyst was causing her lower extremity weakness.  FINAL DIAGNOSES: 1.  Weakness, initial dehydration and deconditioning. The patient was seen in consultation by physical therapy, who recommended home with 24 hour assist. This was set up by the niece, who set up 24/7 aide. The patient would like to go home rather than to rehab.  2.  Hypomagnesemia. This was replaced IV during the hospital course and oral supplementation upon discharge.  3.  Hyponatremia, chronic in nature, better upon discharge.  4.  Hypertension. Blood pressure stable on spironolactone, verapamil and lisinopril. 5.  Polycystic kidney disease. Follows up as an outpatient.  6.  Baker's cyst. Follow up as an outpatient.   TIME SPENT ON DISCHARGE: 35 minutes.    ____________________________ Tana Conch. Leslye Peer, MD rjw:cg D: 11/17/2013 16:00:53 ET T: 11/18/2013 05:28:25 ET JOB#: [id number]  cc: Tana Conch. Leslye Peer, MD, <Dictator> Einar Pheasant, MD Marisue Brooklyn MD ELECTRONICALLY SIGNED 11/19/2013 15:34

## 2014-09-14 NOTE — H&P (Signed)
PATIENT NAME:  Sierra Gardner, Sierra Gardner MR#:  [id number] DATE OF BIRTH:  1924-09-25  DATE OF ADMISSION:  11/15/2013  REASON FOR ADMISSION: Weakness.   REFERRING PHYSICIAN: Lenise Arena, MD  PRIMARY CARE PHYSICIAN: Einar Pheasant, MD  HISTORY OF PRESENT ILLNESS: This is an 81 year old female with history of hyponatremia, hypertension, chronic back problems, polycystic kidney disease and liver disease who comes today with a history of having edema and pain at the level of the left lower extremity. The pain is mostly at the level behind the knee. The swelling is mostly under all that area. The patient went to see her doctor who did an ultrasound of the lower extremity 3 weeks ago showing a Baker's cyst, no DVT. The patient has been having significant pain and difficulty walking due to it and she is favoring the right lower extremity for that. She also has a drop foot on the left side which is a consequence of previous spinal stenosis with surgery. The patient has been progressing to some weakness or some degree of weakness for the past 3 weeks, but last night she went to the bathroom around 10:30 p.m. and she was not able to get up. Her legs did not respond. They were too weak to sustain her weight. For that reason, she spent all night on the toilet. The patient does have a Life Alert or life alarm and she did not use it up until the morning. In the morning. In the morning, she called EMS. EMS picked her up and brought her to the Emergency Department. Overall the patient has been just getting weaker and weaker and has significant difficulty getting around and fending for herself. The patient is going to be admitted as an observation for possible rehabilitation.   REVIEW OF SYSTEMS: Twelve system review. CONSTITUTIONAL: No fever, fatigue. Positive weakness.  EYES: No blurry vision, double vision.  EARS, NOSE, THROAT: No difficulty swallowing or tinnitus.  RESPIRATORY: No shortness of breath, cough or wheezing.   CARDIOVASCULAR: No chest pain, orthopnea or palpitations.  GASTROINTESTINAL: No nausea, vomiting. No diarrhea.  GENITOURINARY: No dysuria, hematuria.  GYNECOLOGIC: No breast masses.  ENDOCRINOLOGY: No polyuria, polydipsia or polyphagia.  HEMATOLOGIC AND LYMPHATIC: No anemia or easy bruising.  SKIN: No rashes, petechiae.  MUSCULOSKELETAL: No significant neck pain or back pain. At this moment, the patient has chronic lower back pain on general basis with weakness of the left lower extremity. She drags her foot. She uses a brace due to dropped foot. NEUROLOGIC: No CVAs, TIAs or headaches.  PSYCHIATRIC: No anxiety or depression.   PAST MEDICAL HISTORY: 1.  Hypertension.  2.  Hyponatremia.  3.  Polycystic kidney disease.  4.  Polycystic liver disease.  5.  Back pain, chronic.  6.  Knee/Baker's cyst. 7.  Osteoarthritis.   SOCIAL HISTORY: She has never smoked, never drank. She was married twice and widowed twice. She lives by herself. She has no kids. Her closest living relative is her niece who helps in taking care of some of her needs.  PAST SURGICAL HISTORY: Hemorrhoidectomy, breast biopsy, cataract surgery in both eyes, total abdominal hysterectomy and 2 back surgeries due to ruptured disk, which left her with left foot drop.   FAMILY HISTORY: Positive for heart failure and leukemia in her father and mother had also heart failure. Family history of polycystic kidney and liver disease is positive.   CURRENT MEDICATIONS: Vitamin D 1000 mg daily, verapamil 100 mg daily, tramadol 50 mg every 4 hours as needed for  pain, spironolactone 25 mg once a day, Prevacid 50 mg once daily, magnesium oxide 250 mg once daily, lisinopril 40 mg twice daily, cranberry once daily, Centrum Silver once daily, calcium plus vitamin D daily, biotin 1000 mcg daily, vitamin C 500 mg once daily.   PHYSICAL EXAMINATION: VITAL SIGNS: Blood pressure 162/64, pulse 77, respirations 18, temperature 98.4, oxygen saturation  100% on room air.  GENERAL: The patient is alert and oriented x3, in no acute distress. No respiratory distress. Hemodynamically stable.  HEENT: Pupils are equal and reactive. Extraocular movements intact. Mucosa is moist. Anicteric sclerae. Pink conjunctivae. No oral lesions. No oropharyngeal exudates.  NECK: Supple. No JVD. No thyromegaly. No adenopathy. No carotid bruits. No rigidity.  CARDIOVASCULAR: Regular rate and rhythm. No murmurs, rubs, or gallops are appreciated. No displacement of PMI.  LUNGS: Clear without any wheezing or crepitus. No use of accessory muscles.  ABDOMEN: Soft, nontender, nondistended. No hepatosplenomegaly. No masses. No rebound tenderness. Bowel sounds are positive.  GENITALIA: Deferred.  EXTREMITIES: Positive mild edema of the left lower extremity, +1 pitting edema. The patient has a brace on that lower extremity and she has some tenderness to palpation at the level under the popliteal fossa where her Baker's cyst is. The right lower extremity does not seem to have any significant edema. VASCULAR: Pulses +2. Capillary refill less than 3.  MUSCULOSKELETAL: No significant joint deformity, joint swelling. Positive left foot drop.   PSYCHIATRIC: The patient is alert and oriented x3. No significant agitation.  NEUROLOGIC: Cranial nerves II through XII intact. Strength is 5/5 in upper extremities and about 3/5 on the left lower extremity, 4/5 of right lower extremity.  SKIN: No rashes or petechiae.  LYMPHATIC: Negative for lymphadenopathy in neck or supraclavicular areas.   DIAGNOSTIC DATA: Overall urinalysis shows 13 white blood cells and 1 leukocyte esterase. We are going to send it for culture. I am not going to treat her for UTI just yet until laboratory confirms or unless there is fever. Her white count is 9.6 and hemoglobin is 12.4. LFTs are normal. BUN is slightly elevated at 33, creatinine 1.02, glucose 117, sodium 129 and potassium 4.6.   EKG: No ST depression or  elevation. No major changes. Normal sinus rhythm.  ASSESSMENT AND PLAN: An 81 year old female with hypertension, hyponatremia and polycystic kidney disease who comes with weakness.  1.  Generalized weakness. The patient has significant deconditioning for over the last 3 months and this is likely secondary to her Baker's cyst creating pain for what the patient has been resting more, not getting up as much, staying in the chair rather than getting around due to the pain. The patient will be evaluated by physical therapy raising the possibility of the patient going to skilled nursing facility for rehabilitation. At this moment, the patient is going to be admitted as an observation with hopes of upgrading to inpatient later. The patient will be evaluated by orthopedics since the Baker's cyst appears to be the cause of her problems. Continue for now tramadol, consider adding on anti-inflammatories for her, but at this moment we are just going to continue tramadol for now as the patient has multiple allergies including naproxen. PT evaluation and treat. 2.  Mild dehydration. The patient looks a little bit dry. Her blood pressure is fine. She is not tachycardiac, but there is slight elevation of the BUN up to 33 and bump on her creatinine up to 1.02. Her sodium is low at 129, but it is always low. Her  previous sodium was 130. Her potassium is normal. Her chloride is slightly decreased. We are going to give her IV fluids, very low rate.  3.  Hyponatremia. This is a chronic problem. Continue to observe.  4.  Hypertension. The patient has history of elevated blood pressure due to her polycystic kidney disease. At this moment, she is taking a lot of medications that could also cause her to be weaker. I am going to continue her Cardizem at the same dose and her Aldactone at the same dose, but we are going to cut the lisinopril to half, to 40 mg once daily instead of twice daily and see if that effects her blood pressure  much. If her blood pressure continues to raise and it stays elevated, put her back on the lisinopril twice daily.  5.  Gastrointestinal prophylaxis with Pepcid.  6.  Deep vein thrombosis prophylaxis with heparin.  7.  As far as her pyuria, the patient has 13 white blood cells, no symptoms of urine infection. We are going to send urine for culture. Not starting treatment at this moment. Will start treatment if the cultures are more definitive or if there is any fever.  8.  Other medical problems are stable. The patient is a FULL code.   TIME SPENT: About 45 minutes.  ____________________________ [place] Sink, MD rsg:sb D: 11/15/2013 11:56:14 ET T: 11/15/2013 12:17:59 ET JOB#: [id number]  cc: [place] Sink, MD, <Dictator> ROBERTO America Brown MD ELECTRONICALLY SIGNED 11/16/2013 15:17

## 2015-06-17 IMAGING — CT CT HEAD WITHOUT CONTRAST
2 series · 14 of 30 positions shown, 16 images · non-contrast
Comparison: None.

CLINICAL DATA: Fall.

EXAM:
CT HEAD WITHOUT CONTRAST
TECHNIQUE: Contiguous axial images were obtained from the base of the skull
through the vertex without intravenous contrast.

[Series 2: head wo · axial · 0.42mm/px · z∈[-106,+10]mm · 6 of 33 slices shown, 8 images]
[im 5/33  brain]
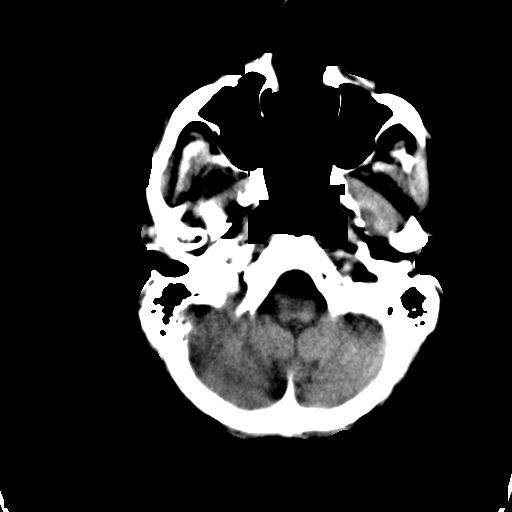
[im 5/33  bone]
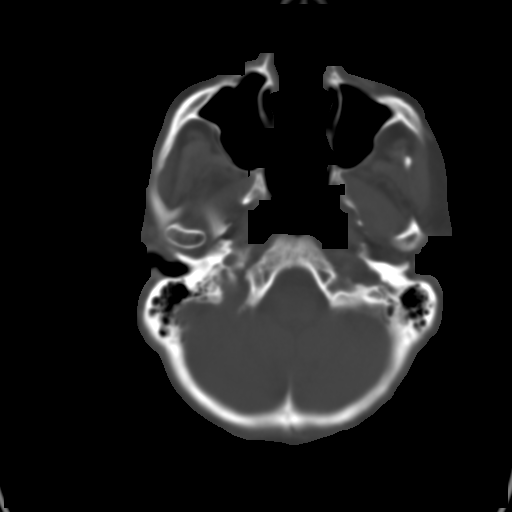
[im 10/33  brain]
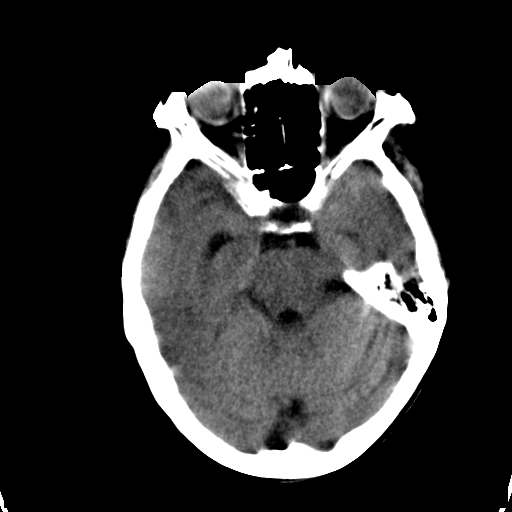
[im 14/33  brain]
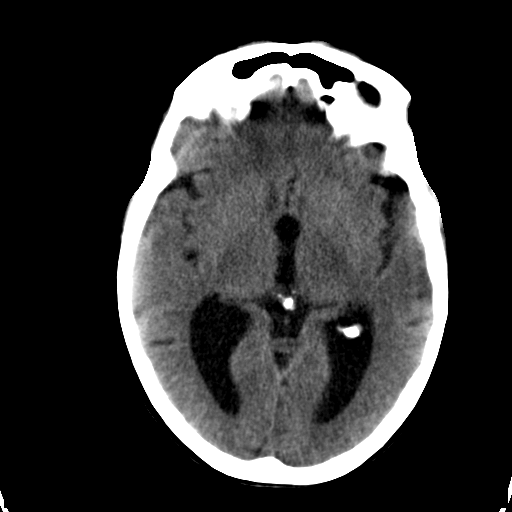
[im 19/33  brain]
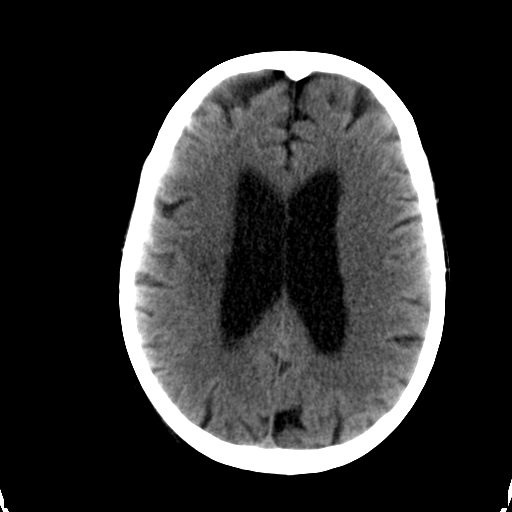
[im 23/33  brain]
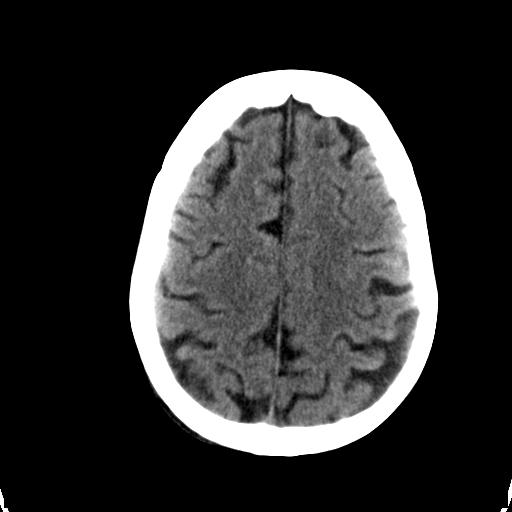
[im 23/33  bone]
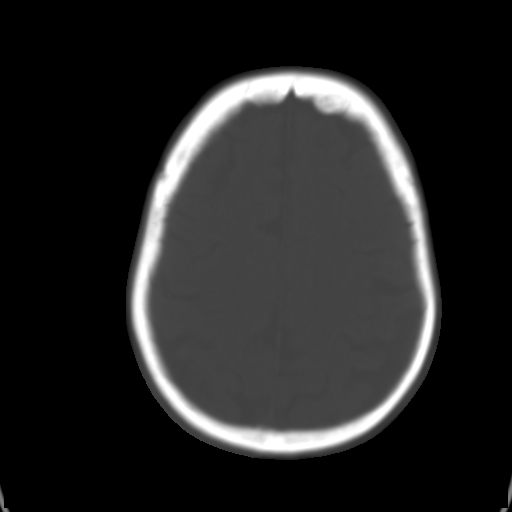
[im 28/33  brain]
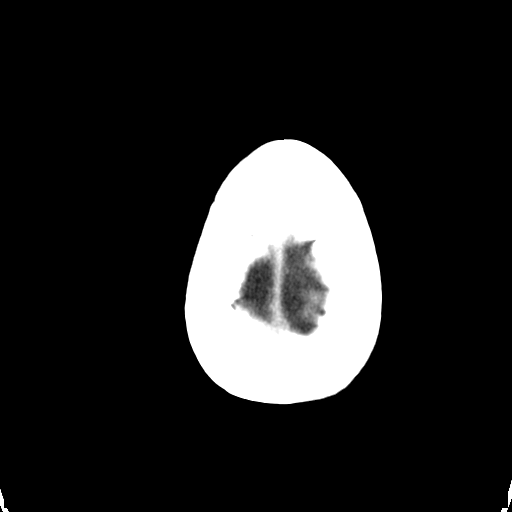

[Series 3: head bone · axial · 0.42mm/px · z∈[-120,+26]mm · 8 of 91 slices shown]
[im 9/91  bone]
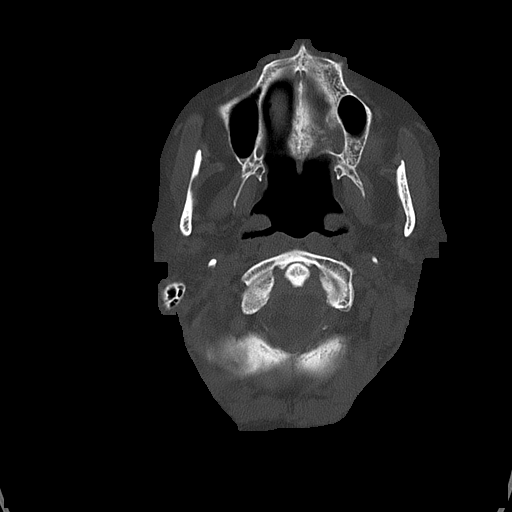
[im 18/91  bone]
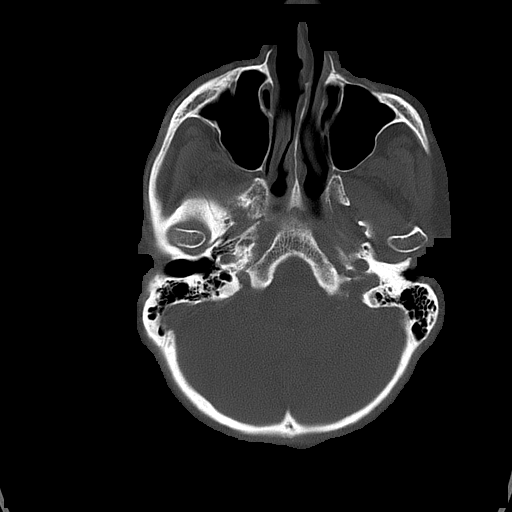
[im 31/91  bone]
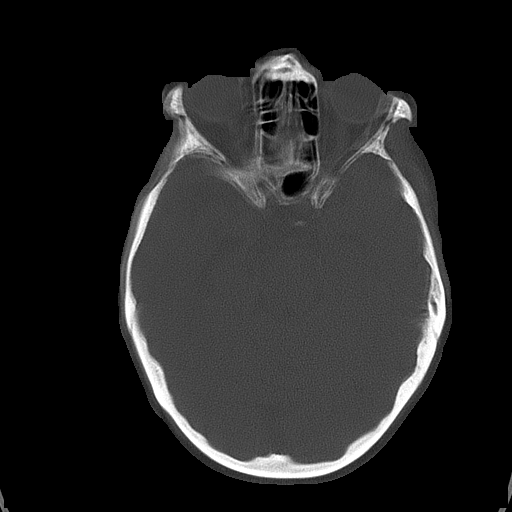
[im 39/91  bone]
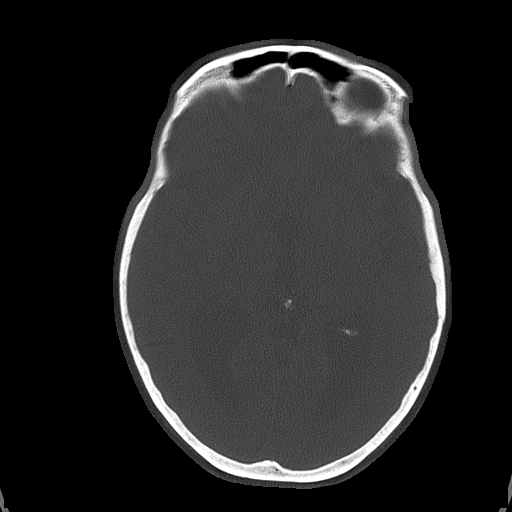
[im 52/91  bone]
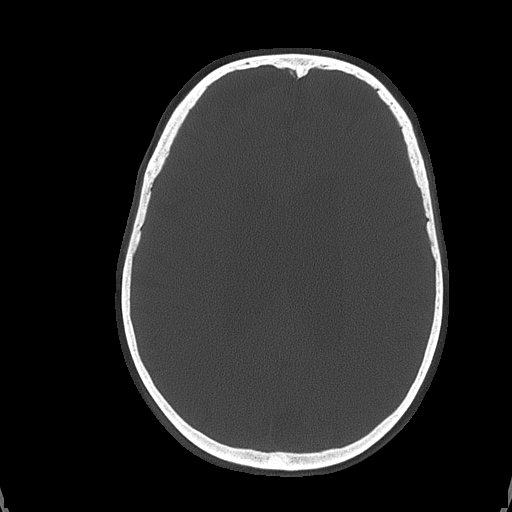
[im 61/91  bone]
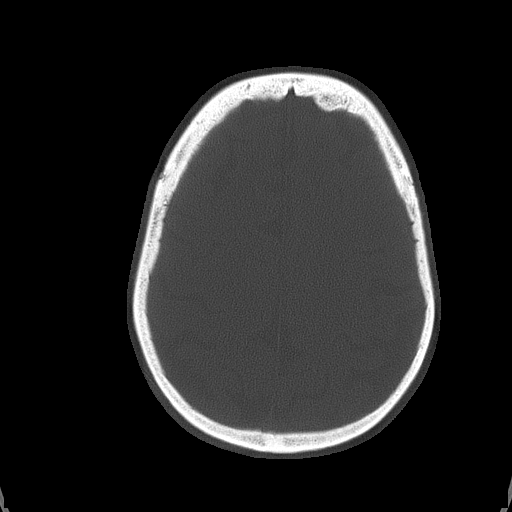
[im 73/91  bone]
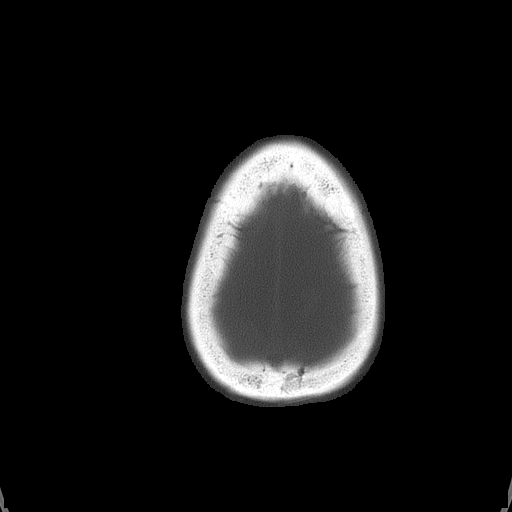
[im 82/91  bone]
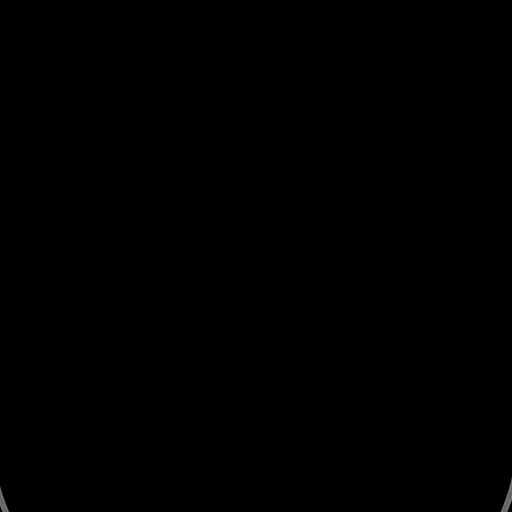

[14 of 30 positions shown; findings below may reference images not displayed]

FINDINGS: No intra- axial or extra-axial pathologic fluid or blood collections
identified. No mass lesion. No hydrocephalus. Motion artifact
present. Soft tissue swelling of the right posterior parietal
region. No underlying bony abnormality. No evidence of fracture.
Visualized paranasal sinuses and mastoids are clear.
IMPRESSION: No acute abnormality.
